# Patient Record
Sex: Male | Born: 1955 | Race: White | Hispanic: No | Marital: Married | Smoking: Never smoker
Health system: Southern US, Community
[De-identification: ages and names within clinical notes are randomized; demographics above are authoritative.]

## PROBLEM LIST (undated history)

## (undated) DIAGNOSIS — G4730 Sleep apnea, unspecified: Secondary | ICD-10-CM

## (undated) DIAGNOSIS — K219 Gastro-esophageal reflux disease without esophagitis: Secondary | ICD-10-CM

## (undated) DIAGNOSIS — K5090 Crohn's disease, unspecified, without complications: Secondary | ICD-10-CM

## (undated) DIAGNOSIS — M1990 Unspecified osteoarthritis, unspecified site: Secondary | ICD-10-CM

## (undated) DIAGNOSIS — Z87442 Personal history of urinary calculi: Secondary | ICD-10-CM

## (undated) DIAGNOSIS — L570 Actinic keratosis: Secondary | ICD-10-CM

## (undated) DIAGNOSIS — K760 Fatty (change of) liver, not elsewhere classified: Secondary | ICD-10-CM

## (undated) DIAGNOSIS — I10 Essential (primary) hypertension: Secondary | ICD-10-CM

## (undated) HISTORY — DX: Essential (primary) hypertension: I10

## (undated) HISTORY — DX: Actinic keratosis: L57.0

## (undated) HISTORY — DX: Gastro-esophageal reflux disease without esophagitis: K21.9

---

## 2004-06-19 ENCOUNTER — Ambulatory Visit: Payer: Self-pay | Admitting: Internal Medicine

## 2004-08-01 ENCOUNTER — Ambulatory Visit: Payer: Self-pay | Admitting: Unknown Physician Specialty

## 2006-07-05 ENCOUNTER — Inpatient Hospital Stay: Payer: Self-pay | Admitting: Internal Medicine

## 2006-08-23 ENCOUNTER — Inpatient Hospital Stay: Payer: Self-pay | Admitting: Internal Medicine

## 2006-10-30 ENCOUNTER — Inpatient Hospital Stay: Payer: Self-pay | Admitting: General Surgery

## 2006-10-30 ENCOUNTER — Other Ambulatory Visit: Payer: Self-pay

## 2007-05-29 ENCOUNTER — Ambulatory Visit: Payer: Self-pay | Admitting: Unknown Physician Specialty

## 2008-07-15 ENCOUNTER — Inpatient Hospital Stay: Payer: Self-pay | Admitting: Internal Medicine

## 2009-03-16 IMAGING — CT CT ABD-PELV W/ CM
1 of 2 series · 15 of 32 positions shown, 19 images · non-contrast
Comparison: none

REASON FOR EXAM: crohn's flare
COMMENTS:

[Series 2: abdomen · axial · 0.90mm/px · z∈[-894,-454]mm · 15 of 61 slices shown, 19 images]
[im 3/61  soft-tissue]
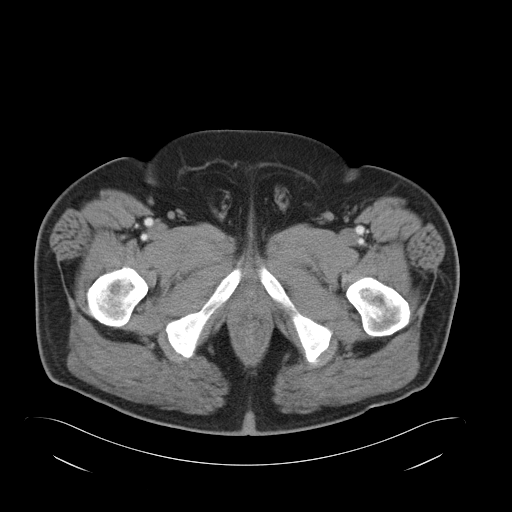
[im 3/61  bone]
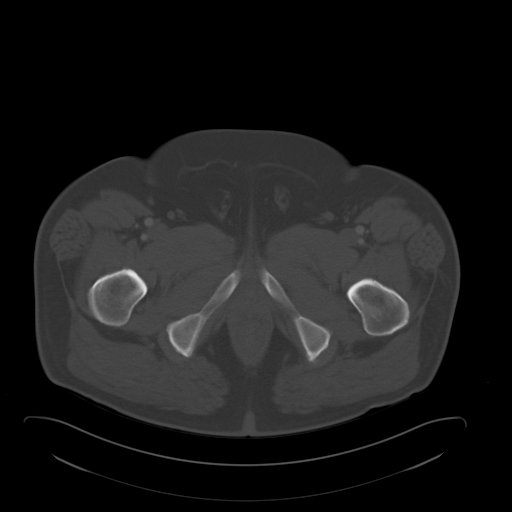
[im 8/61  soft-tissue]
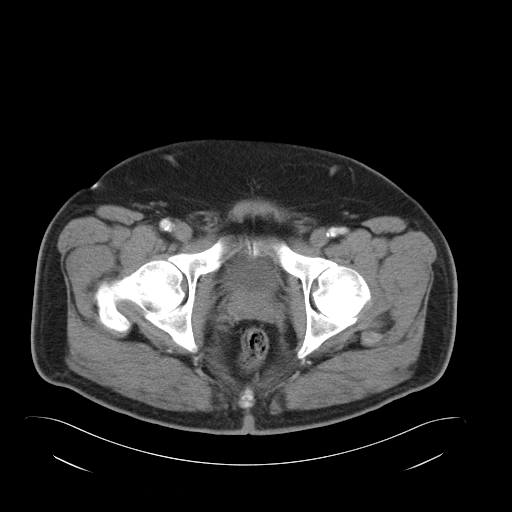
[im 13/61  soft-tissue]
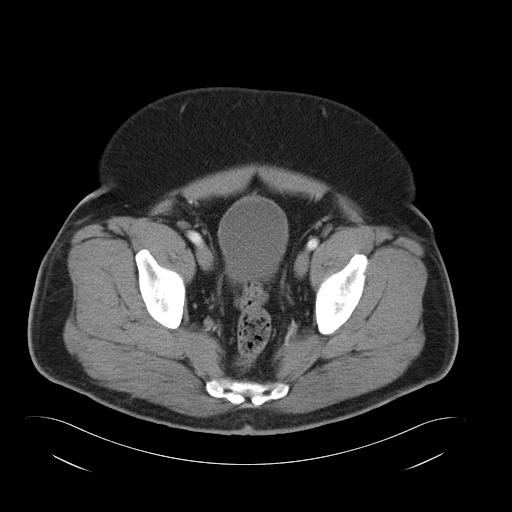
[im 18/61  soft-tissue]
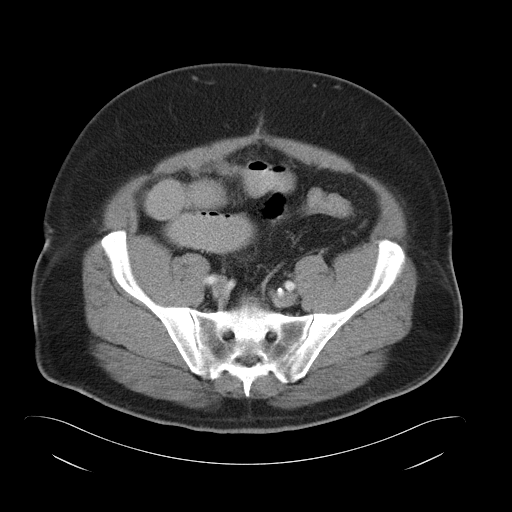
[im 21/61  soft-tissue]
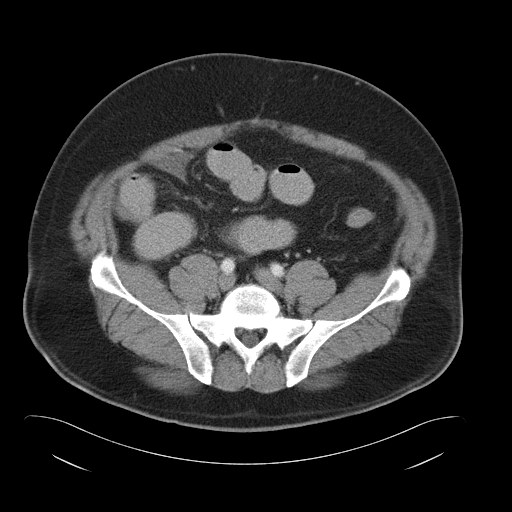
[im 26/61  soft-tissue]
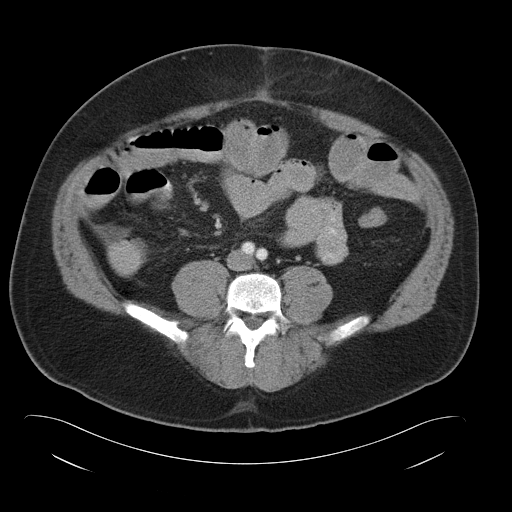
[im 31/61  soft-tissue]
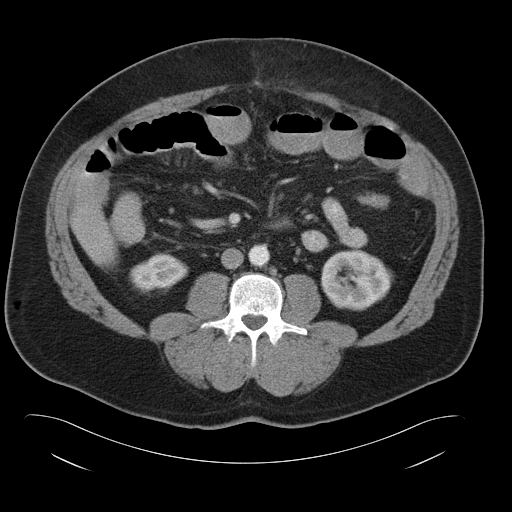
[im 36/61  soft-tissue]
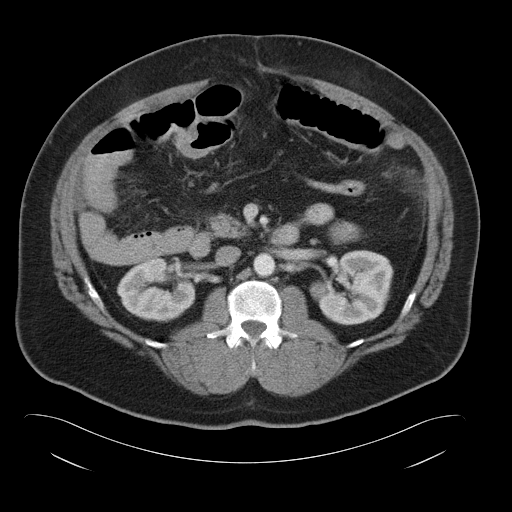
[im 41/61  soft-tissue]
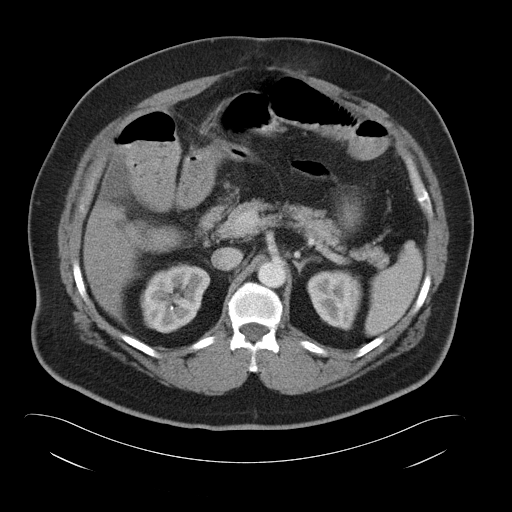
[im 41/61  bone]
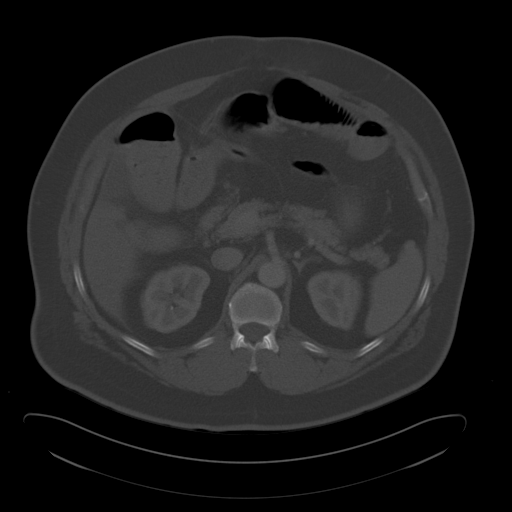
[im 43/61  soft-tissue]
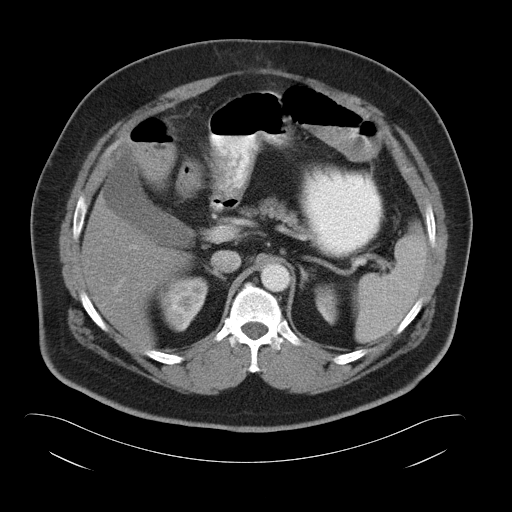
[im 48/61  soft-tissue]
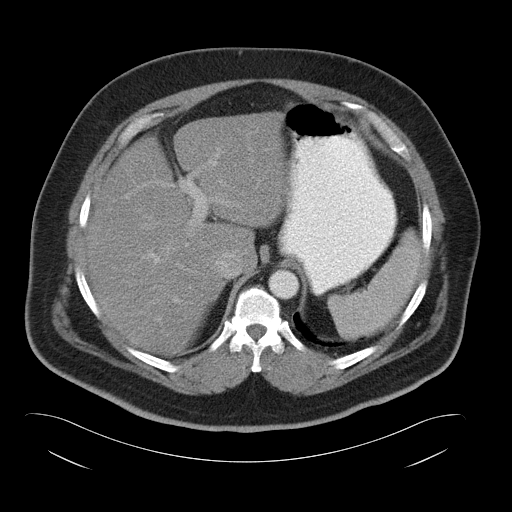
[im 51/61  lung]
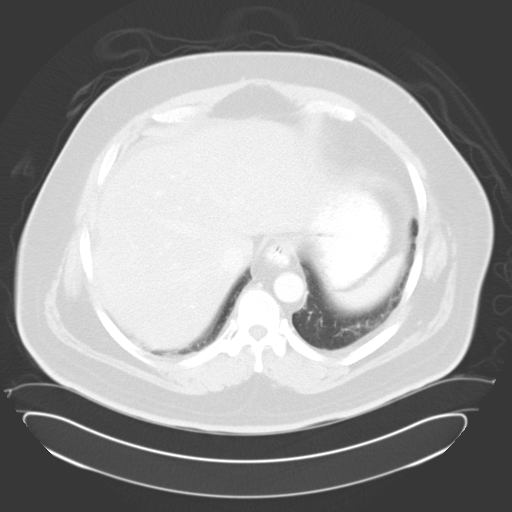
[im 53/61  soft-tissue]
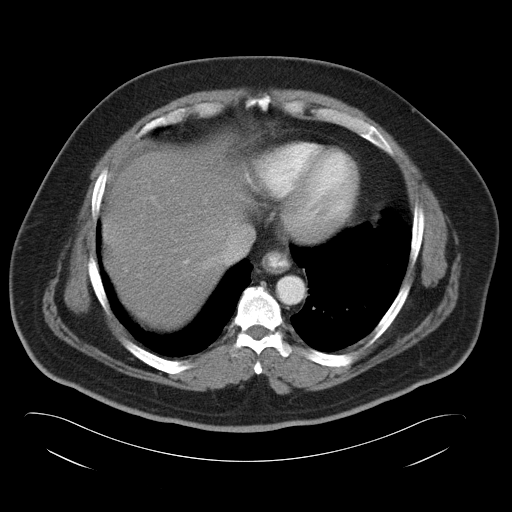
[im 53/61  lung]
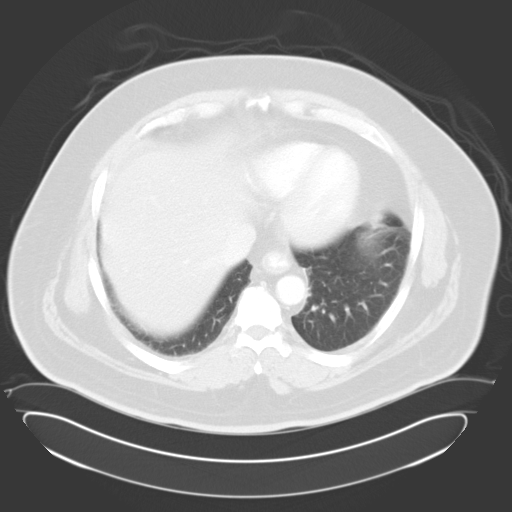
[im 56/61  lung]
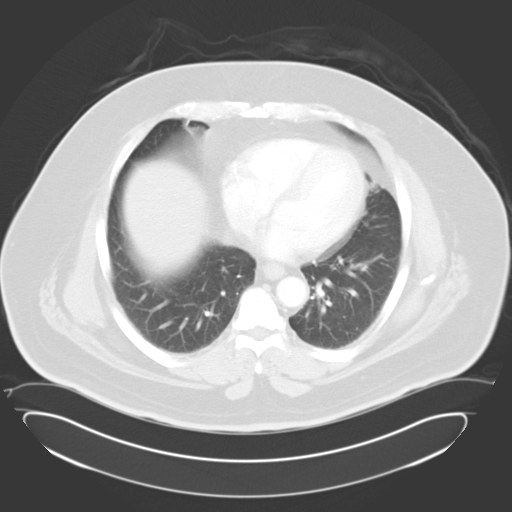
[im 58/61  soft-tissue]
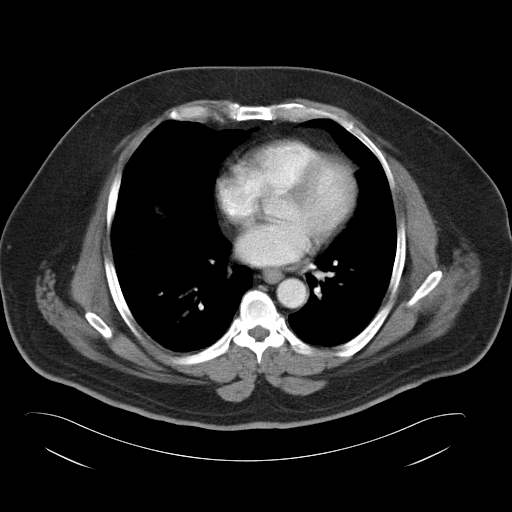
[im 58/61  lung]
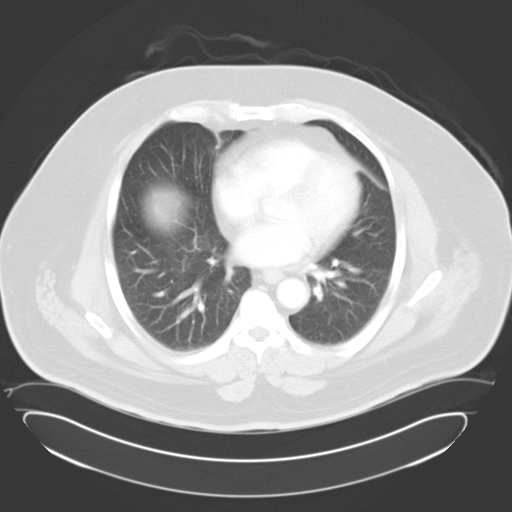

[15 of 32 positions shown; findings below may reference images not displayed]

PROCEDURE:     CT  - CT ABDOMEN / PELVIS  W  - July 06, 2006  [DATE]

RESULT:      IV and oral contrast enhanced CT of the Abdomen and Pelvis was
obtained.  The liver and spleen are normal.  The pancreas is unremarkable.
The gallbladder is nondistended.  No biliary distention is noted.  A small
sliding hiatal hernia is present. The adrenals and kidneys are normal.
RIGHT nephrolithiasis is present.  Dilated loops of small and large bowel
are noted.  By history, the patient has Crohn's disease.  The dilated loops
of small bowel may be related to Crohn's disease or partial small bowel
obstruction. No evidence of complete obstruction, however, is identified.
No evidence of abscess.  The lung bases are clear.
IMPRESSION: 1.     Mild to moderately dilated loops of small bowel.  By history, the
patient has Crohn's disease. These changes may be related to Crohn's
disease.
2.     There is no evidence of complete bowel obstruction.  Stool is noted
in the colon.  It may be wise to perform follow up abdominal series to
exclude developing bowel obstruction.  There is no evidence of
intraabdominal or intrapelvic abscess. The remainder of the exam is
unremarkable.

## 2009-12-27 ENCOUNTER — Ambulatory Visit: Payer: Self-pay | Admitting: Unknown Physician Specialty

## 2010-06-15 ENCOUNTER — Ambulatory Visit: Payer: Self-pay | Admitting: Unknown Physician Specialty

## 2012-01-31 ENCOUNTER — Ambulatory Visit: Payer: Self-pay | Admitting: Unknown Physician Specialty

## 2012-04-15 HISTORY — PX: COLON SURGERY: SHX602

## 2012-04-15 HISTORY — PX: HERNIA REPAIR: SHX51

## 2012-07-23 ENCOUNTER — Ambulatory Visit: Payer: Self-pay | Admitting: Unknown Physician Specialty

## 2012-08-07 ENCOUNTER — Ambulatory Visit: Payer: Self-pay | Admitting: Surgery

## 2012-08-24 ENCOUNTER — Ambulatory Visit: Payer: Self-pay | Admitting: Surgery

## 2012-08-31 ENCOUNTER — Inpatient Hospital Stay: Payer: Self-pay | Admitting: Surgery

## 2012-09-01 LAB — BASIC METABOLIC PANEL
Anion Gap: 7 (ref 7–16)
BUN: 8 mg/dL (ref 7–18)
Calcium, Total: 8.4 mg/dL — ABNORMAL LOW (ref 8.5–10.1)
Creatinine: 0.57 mg/dL — ABNORMAL LOW (ref 0.60–1.30)
EGFR (Non-African Amer.): >60
Glucose: 115 mg/dL — ABNORMAL HIGH (ref 65–99)
Potassium: 3.3 mmol/L — ABNORMAL LOW (ref 3.5–5.1)

## 2012-09-01 LAB — CBC WITH DIFFERENTIAL/PLATELET
Basophil #: 0 10*3/uL (ref 0.0–0.1)
Basophil %: 0.4 %
Eosinophil #: 0 10*3/uL (ref 0.0–0.7)
Eosinophil %: 0.3 %
Lymphocyte %: 7.3 %
MCHC: 33.7 g/dL (ref 32.0–36.0)
MCV: 79 fL — ABNORMAL LOW (ref 80–100)
Monocyte #: 1.2 x10 3/mm — ABNORMAL HIGH (ref 0.2–1.0)
Monocyte %: 11.2 %
Neutrophil #: 8.8 10*3/uL — ABNORMAL HIGH (ref 1.4–6.5)
Neutrophil %: 80.8 %
RBC: 4.82 10*6/uL (ref 4.40–5.90)
RDW: 17.6 % — ABNORMAL HIGH (ref 11.5–14.5)
WBC: 10.9 10*3/uL — ABNORMAL HIGH (ref 3.8–10.6)

## 2012-09-03 LAB — PATHOLOGY REPORT

## 2013-07-19 ENCOUNTER — Ambulatory Visit: Payer: Self-pay | Admitting: Unknown Physician Specialty

## 2014-08-05 NOTE — Op Note (Signed)
PATIENT NAME:  Chad Cardenas, Chad Cardenas MR#:  [id number] DATE OF BIRTH:  11/27/1955  DATE OF PROCEDURE:  08/31/2012  PREOPERATIVE DIAGNOSIS: Crohn's disease, with 2 left colonic strictures.   POSTOPERATIVE DIAGNOSES: Crohn's disease, with 2 left colonic strictures,  adhesive peritonitis, multiple ventral hernias.   OPERATION: Left colectomy, repair of 5 ventral hernias, extensive enterolysis.   SURGEON: Renda RollsWilton Dionel Archey, M.D.   ANESTHESIA: General.   INDICATIONS: This 59 year old male has a history of Crohn's disease. He had had a previous right colectomy in 1992, and in 2008 had resection of the anastomosis. He had a recent attempted colonoscopy. However, the scope would not go by the stricture in the descending colon. Subsequent barium enema demonstrated 2 strictures, one was at the old anastomosis and the other in the junction of the descending colon and sigmoid colon, and surgery was recommended for definitive treatment.   DESCRIPTION OF PROCEDURE: The patient was placed on the operating table in the supine position under general endotracheal anesthesia. The abdomen was prepared with ChloraPrep and draped in a sterile manner.   A midline incision was made at the site of an old scar and was lengthened several times to ultimately some 21 cm. As the fascia was opened there were findings of multiple intra-abdominal adhesions between the omentum and the anterior abdominal wall, and also between the small bowel and the anterior abdominal wall. Multiple adhesions were taken down. Five ventral hernias were found during the course of the dissection, and 3 of the ventral hernias were repaired during the beginning of the operation. There were 2 ventral hernias which were found which were approximately 1 cm off the midline in the portion of the incision just above the umbilicus. Each of these were closed with transversely-oriented 0 Maxon figure-of-eight sutures. There was another ventral hernia found in the epigastrium  which was on the patient's right side, and this had a defect of some 2 cm in the properitoneal fat. This was dissected away and it was closed transversely with interrupted 0 Maxon figure-of-eight sutures. There were two other hernias in the more cephalad portion of the incision that were repaired at the end of the operation.   Next, extensive enterolysis was undertaken, finding an extensive adhesive peritonitis, and that  multiple segments of small bowel were adherent to one another. There was evidence of a mass formation surrounding the junction of the small bowel and the transverse colon which was in the upper abdominal midline. There was appearance consistent with Crohn's disease of creeping fat involving the left colon. The dissection of the adhesions continued for more than 2 hours and more than half the operation. Subsequently mobilizing the transverse colon, mobilizing the splenic flexure the segments of bowel to be repaired were identified, finding some soft, supple proximal sigmoid colon and a window was created in the mesentery. The mesenteric dissection was begun with a Harmonic scalpel. Also, a window was created in the small bowel mesentery just proximal to the old anastomosis and dissection here began with a Harmonic scalpel. It is further noted that the omentum was dissected away from the left transverse colon, also finding multiple adhesions in this area.   The dissection was continued, further mobilizing the splenic flexure, and subsequently the bowel was satisfactorily mobilized. The mesenteric dissection was continued. Several clamped vessels were suture-ligated with 0 chromic and the bowel was resected proximally and distally by transecting with electrocautery, and each end was held up with Allis clamps. The specimen was submitted in formalin for routine pathology.  The anastomosis was carried out with the triangulation technique, actually using 4 loads of the TA-30 stapler beginning along the  posterior wall, and then continued until the anastomosis was completed. It was widely patent and was inspected and appeared to be intact.   Gloves and gown were changed, and also the Mayo stand and instruments were changed. Next, the mesenteric defect was closed with running 3-0 chromic. It was noted that during the course of the procedure there was approximately 50 mL of blood loss, and by this point hemostasis appeared to be intact. The bowel was returned to the abdominal cavity, and the omentum which remained was brought beneath the wound.   Next, two additional ventral hernias were repaired in the upper aspect of the wound, which each of these had a defect of 2 cm, one on each side at the epigastric midline fascia, and each of these were closed with a transversely-oriented suture line of interrupted 0 Maxon figure-of-eight sutures.   Next, the midline incision was closed with interrupted 0 Maxon figure-of-eight sutures in the midline fascia and then the skin was closed with clips. Dressings were applied using 4 x 4 gauze and Micropore tape.   The patient had been asleep for some 5 hours, and I had the circulating nurse inserted a Foley urinary catheter with Betadine preparation of the penis, draining 100 mL of clear, colorless urine.   The patient is now being prepared for transfer to the recovery room.   This operation was greater than the usual amount of effort, lasting some 5 hours, and more than half the operation was spent in lysis of adhesions and a significant amount, more than 30 minutes, spent repairing the 5 ventral hernia.   The patient was in satisfactory condition and being prepared for transfer to the recovery room in stable condition.   ____________________________ Shela Commons. Renda Rolls, MD jws:dm D: 08/31/2012 12:44:06 ET T: 08/31/2012 13:56:35 ET JOB#: [id number]  cc: Adella Hare, MD, <Dictator> Adella Hare MD ELECTRONICALLY SIGNED 09/01/2012 10:20

## 2014-08-05 NOTE — Discharge Summary (Signed)
PATIENT NAME:  Chad Cardenas, Anthoni MR#:  [id number] DATE OF BIRTH:  01-05-1956  DATE OF ADMISSION:  08/31/2012 DATE OF DISCHARGE:  09/04/2012  DATE OF ADMISSION: 08/31/2012.   DATE OF DISCHARGE: 09/04/2012.   HISTORY OF PRESENT ILLNESS: This 59 year old was brought in for elective surgery and has a history of Crohn's disease. Has had previous right colectomy. Had recent findings of 2 colonic strictures involving the left colon.   Other past medical history includes hypertension, sleep apnea, hyperlipidemia, B12 deficiency fatty liver.   Details of the history and physical were recorded.  HOSPITAL COURSE: He was brought in through the outpatient surgery department. He did have a preop prophylactic antibiotic. Had a laparotomy with findings of extensive adhesive peritonitis. Much time was spent lysing adhesions. Also had 5 ventral hernias, all of which were repaired. He had the left colectomy.   Postoperatively, he was treated with IV fluids, also prophylactic subcutaneous heparin. He was begun initially on a clear liquid diet and later advanced to full liquids and gradually advanced to solid food. He had minimal amount of pain postoperatively and progressed satisfactorily.   His final pathology did demonstrate Crohn's disease with ulceration and stricture.   FINAL DIAGNOSES:  1. Crohn's disease of left colon with 2 strictures with partial obstruction.  2. Adhesive peritonitis. 3. Five ventral hernias.   DISCHARGE INSTRUCTIONS: Wound care instructions given. Avoid straining or heavy lifting. Follow up in the office for clip removal. Also anticipate gastroenterology followup, and at some point, resume his Humira.  ____________________________ Shela CommonsJ. Renda RollsWilton Smith, MD jws:OSi D: 09/18/2012 09:23:35 ET T: 09/18/2012 09:30:56 ET JOB#: [id number]  cc: Adella HareJ. Wilton Smith, MD, <Dictator> Adella HareWILTON J SMITH MD ELECTRONICALLY SIGNED 09/23/2012 8:39

## 2015-02-09 ENCOUNTER — Ambulatory Visit (INDEPENDENT_AMBULATORY_CARE_PROVIDER_SITE_OTHER): Payer: BC Managed Care – PPO | Admitting: Podiatry

## 2015-02-09 ENCOUNTER — Ambulatory Visit (INDEPENDENT_AMBULATORY_CARE_PROVIDER_SITE_OTHER): Payer: BC Managed Care – PPO

## 2015-02-09 ENCOUNTER — Encounter: Payer: Self-pay | Admitting: Podiatry

## 2015-02-09 VITALS — BP 149/98 | HR 67 | Resp 18

## 2015-02-09 DIAGNOSIS — M722 Plantar fascial fibromatosis: Secondary | ICD-10-CM

## 2015-02-09 NOTE — Progress Notes (Signed)
   Subjective:    Patient ID: Chad Cardenas, male    DOB: 27-Apr-1955, 59 y.o.   MRN: [id number]  HPI  59 year old male presents the office with concerns of right heel pain foot pain which has been ongoing for approximately 2 months. He states he has a lot of pain when he first gets up or after standing all day. He's been taking ibuprofen which seems to help some does not provide relief. He denies a history of injury or trauma to the area. No swelling or redness. No tingling or numbness. The pain does not wake him up at night. No other complaints at this time.   Review of Systems  All other systems reviewed and are negative.      Objective:   Physical Exam General: AAO x3, NAD  Dermatological: Skin is warm, dry and supple bilateral. Nails x 10 are well manicured; remaining integument appears unremarkable at this time. There are no open sores, no preulcerative lesions, no rash or signs of infection present.  Vascular: Dorsalis Pedis artery and Posterior Tibial artery pedal pulses are 2/4 bilateral with immedate capillary fill time. Pedal hair growth present. No varicosities and no lower extremity edema present bilateral. There is no pain with calf compression, swelling, warmth, erythema.   Neruologic: Grossly intact via light touch bilateral. Vibratory intact via tuning fork bilateral. Protective threshold with Semmes Wienstein monofilament intact to all pedal sites bilateral. Patellar and Achilles deep tendon reflexes 2+ bilateral. No Babinski or clonus noted bilateral.   Musculoskeletal: No gross boney pedal deformities bilateral. This to palpation of the plantar medial tubercle of the calcaneus at the insertion the plantar fascia of the right side. There is mild discomfort on the medial band of the plantar fascia within the arch of the foot. Plantar fascia appears to be intact. There is no pain along the course of the Achilles tendon however equinus is present. There is no pain with lateral  compression of the calcaneus. There is no other areas of tenderness to bilateral lower extremities.No pain, crepitus, or limitation noted with foot and ankle range of motion bilateral. Muscular strength 5/5 in all groups tested bilateral.  Gait: Unassisted, Nonantalgic.      Assessment & Plan:  59 year old male with right heel/foot pain, likely plantar fasciitis -X-rays were obtained and reviewed with the patient.  -Treatment options discussed including all alternatives, risks, and complications -Etiology of symptoms were discussed -Patient elects to proceed with steroid injection into the right heel. Under sterile skin preparation, a total of 2.5cc of kenalog 10, 0.5% Marcaine plain, and 2% lidocaine plain were infiltrated into the symptomatic area without complication. A band-aid was applied. Patient tolerated the injection well without complication. Post-injection care with discussed with the patient. Discussed with the patient to ice the area over the next couple of days to help prevent a steroid flare.  -Plantar fascial brace dispensed -Ice to the area. Discussed stretching exercises daily. -Continue supportive shoe gear. Discussed orthotics. He did recently purchased new shoes and orthotics which seem to help. Continue with this. -Follow-up in 4 weeks or sooner if any problems arise. In the meantime, encouraged to call the office with any questions, concerns, change in symptoms.   Ovid CurdMatthew Wagoner, DPM

## 2015-02-09 NOTE — Patient Instructions (Signed)

## 2015-03-02 ENCOUNTER — Encounter: Payer: Self-pay | Admitting: Podiatry

## 2015-03-02 ENCOUNTER — Ambulatory Visit (INDEPENDENT_AMBULATORY_CARE_PROVIDER_SITE_OTHER): Payer: BC Managed Care – PPO | Admitting: Podiatry

## 2015-03-02 VITALS — BP 159/99 | HR 71 | Resp 18

## 2015-03-02 DIAGNOSIS — M722 Plantar fascial fibromatosis: Secondary | ICD-10-CM | POA: Diagnosis not present

## 2015-03-02 DIAGNOSIS — M2010 Hallux valgus (acquired), unspecified foot: Secondary | ICD-10-CM | POA: Insufficient documentation

## 2015-03-02 DIAGNOSIS — M2011 Hallux valgus (acquired), right foot: Secondary | ICD-10-CM

## 2015-03-02 NOTE — Progress Notes (Signed)
Patient ID: Chad Cardenas, male   DOB: 1955/09/12, 59 y.o.   MRN: [id number]  Subjective: Chad Cardenas presents to the office today for follow-up evaluation of right heel pain. They state that they are doing much better and his pain has greatly improved. They have been icing, stretching, try to wear supportive shoe as much as possible. Denies any  Numbness or tingling. No swelling or redness. The pain does not wake him up at night. He does get some occasional pain to the bunion on the right side was in his shoes. Denies any redness to the area any swelling.  No other complaints at this time. No acute changes since last appointment. They deny any systemic complaints such as fevers, chills, nausea, vomiting.  Objective: General: AAO x3, NAD  Dermatological: Skin is warm, dry and supple bilateral. Nails x 10 are well manicured; remaining integument appears unremarkable at this time. There are no open sores, no preulcerative lesions, no rash or signs of infection present.  Vascular: Dorsalis Pedis artery and Posterior Tibial artery pedal pulses are 2/4 bilateral with immedate capillary fill time. Pedal hair growth present. There is no pain with calf compression, swelling, warmth, erythema.   Neruologic: Grossly intact via light touch bilateral. Vibratory intact via tuning fork bilateral. Protective threshold with Semmes Wienstein monofilament intact to all pedal sites bilateral.   Musculoskeletal: There is greatly improved continue tenderness palpation along the plantar medial tubercle of the calcaneus at the insertion of the plantar fascia on the  right foot. There is  no pain along the course of the plantar fascia within the arch of the foot. Plantar fascia appears to be intact bilaterally. There is no pain with lateral compression of the calcaneus and there is no pain with vibratory sensation. There is no pain along the course or insertion of the Achilles tendon. There are no other areas of  tenderness to bilateral lower extremities.  Mild HAV deformity is present and there is mild tenderness to palpation along the medial aspect of the first metatarsal head on the right foot. There is no overlying erythema , skin lesions or any swelling. No other areas of tenderness.No gross boney pedal deformities bilateral. No pain, crepitus, or limitation noted with foot and ankle range of motion bilateral. Muscular strength 5/5 in all groups tested bilateral.  Gait: Unassisted, Nonantalgic.   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 Under sterile skin preparation, a total of 2.5cc of kenalog 10, 0.5% Marcaine plain, and 2% lidocaine plain were infiltrated into the symptomatic area without complication. A band-aid was applied. Patient tolerated the injection well without complication. Post-injection care with discussed with the patient. Discussed with the patient to ice the area over the next couple of days to help prevent a steroid flare.  -Ice and stretching exercises on a daily basis. -Continue supportive shoe gear. -Discussed offloading pads in shoe gear modifications for the bunion. -Follow-up if symptoms not resolved completely in 3-4 weeks or sooner if any problems arise. In the meantime, encouraged to call the office with any questions, concerns, change in symptoms.   Ovid CurdMatthew Teodoro Jeffreys, DPM

## 2015-04-03 IMAGING — CT CT ABD-PELV W/ CM
1 of 2 series · 14 of 32 positions shown, 18 images · non-contrast
Comparison: none

REASON FOR EXAM: Crohn's Disease
COMMENTS:

[Series 2: abd 3mm w 3.0 i40f 3 · axial · 0.95mm/px · z∈[-950,-542]mm · 14 of 150 slices shown, 18 images]
[im 7/150  soft-tissue]
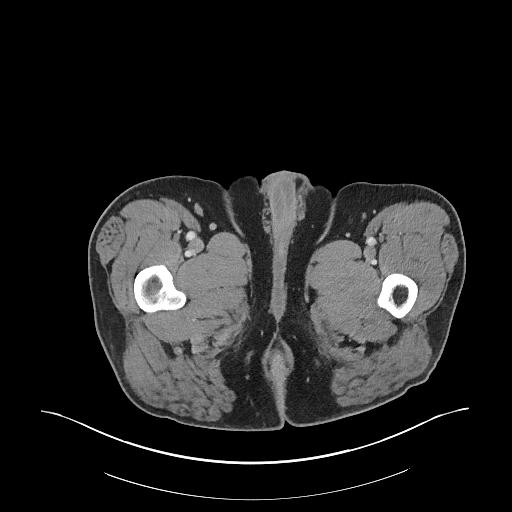
[im 7/150  bone]
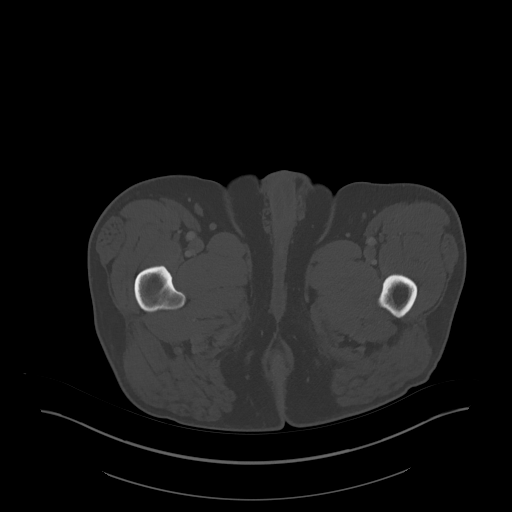
[im 19/150  soft-tissue]
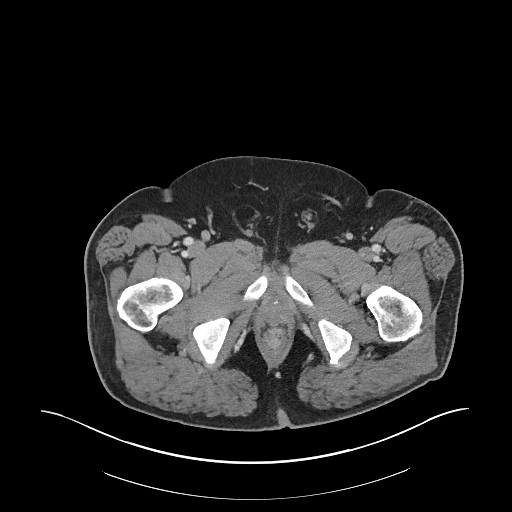
[im 32/150  soft-tissue]
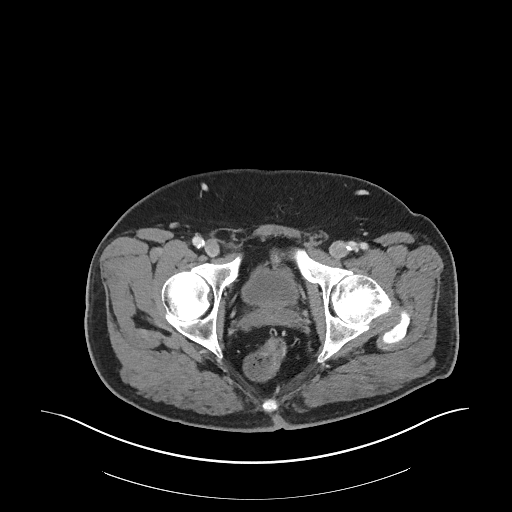
[im 44/150  soft-tissue]
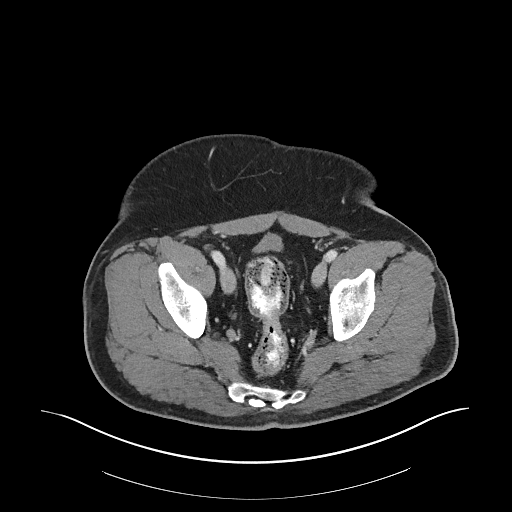
[im 56/150  soft-tissue]
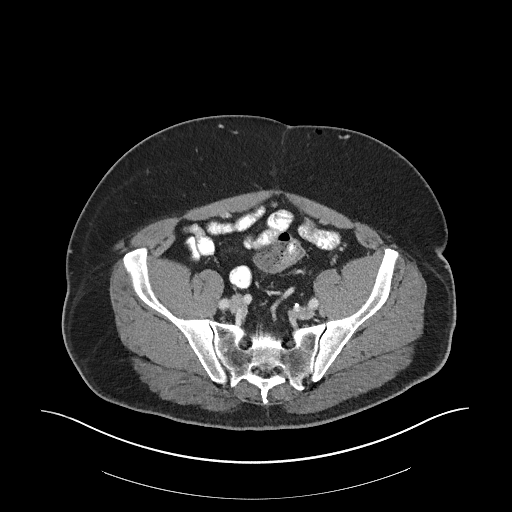
[im 69/150  soft-tissue]
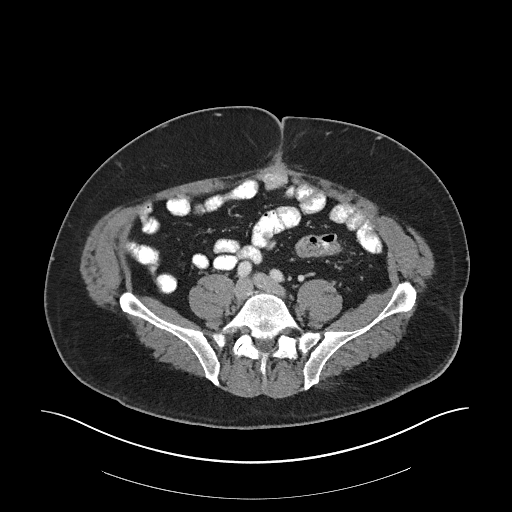
[im 81/150  soft-tissue]
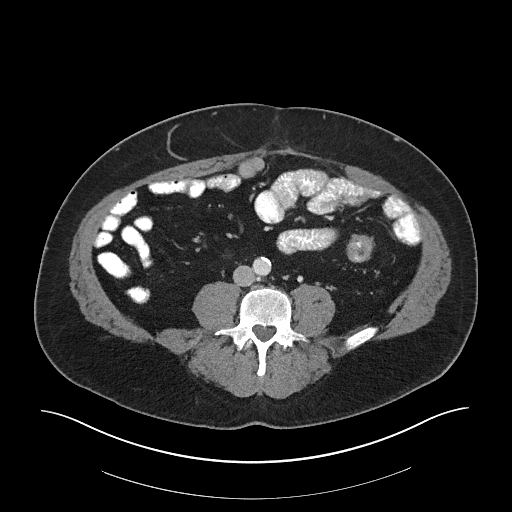
[im 94/150  soft-tissue]
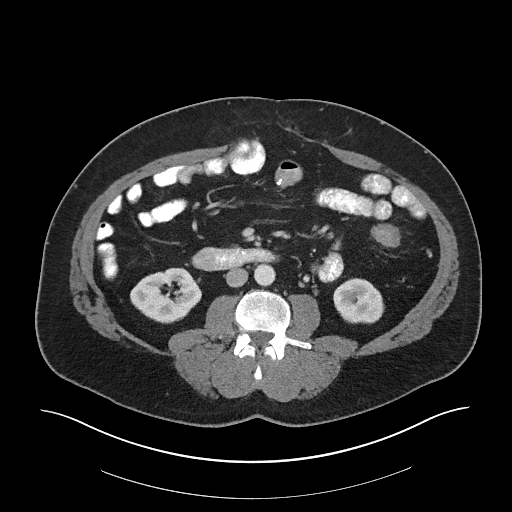
[im 106/150  soft-tissue]
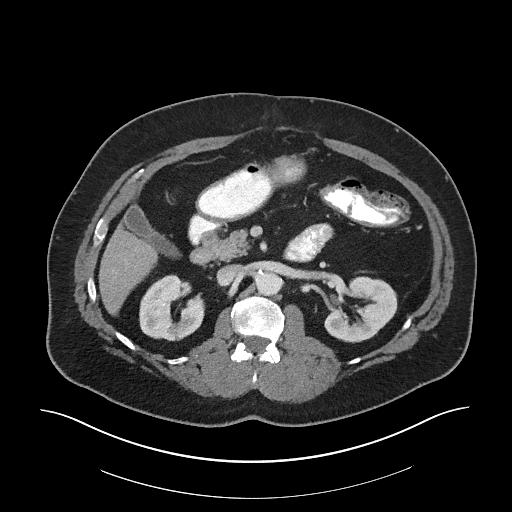
[im 106/150  bone]
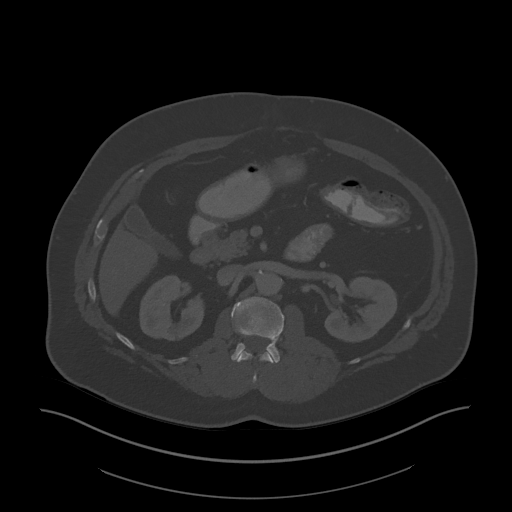
[im 118/150  soft-tissue]
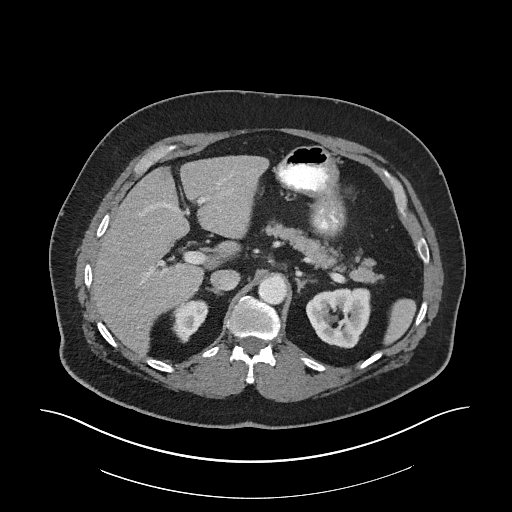
[im 125/150  lung]
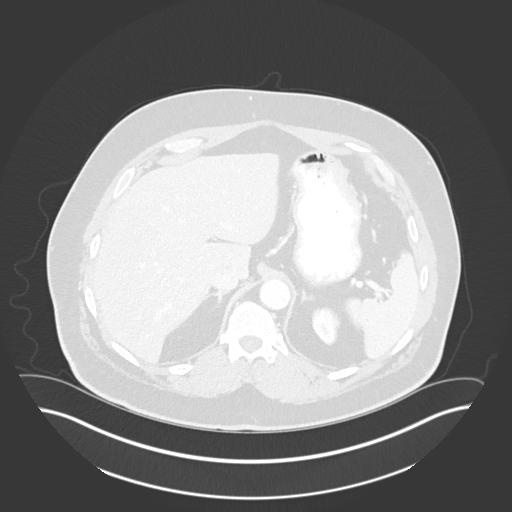
[im 131/150  soft-tissue]
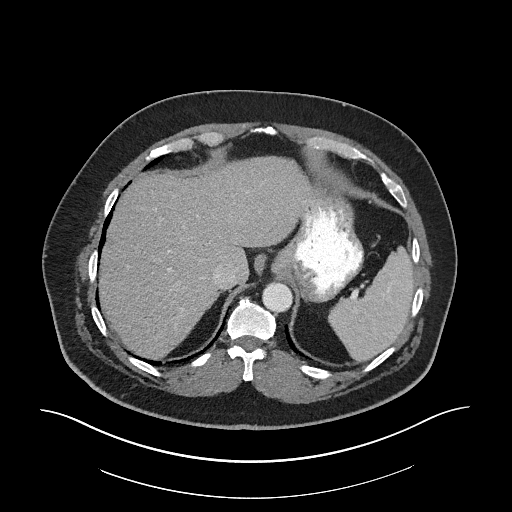
[im 131/150  lung]
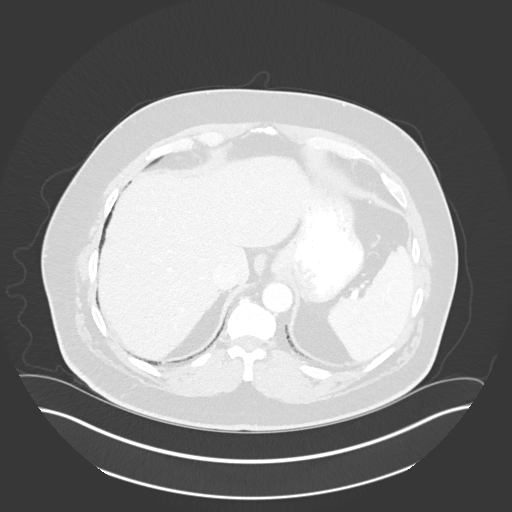
[im 137/150  lung]
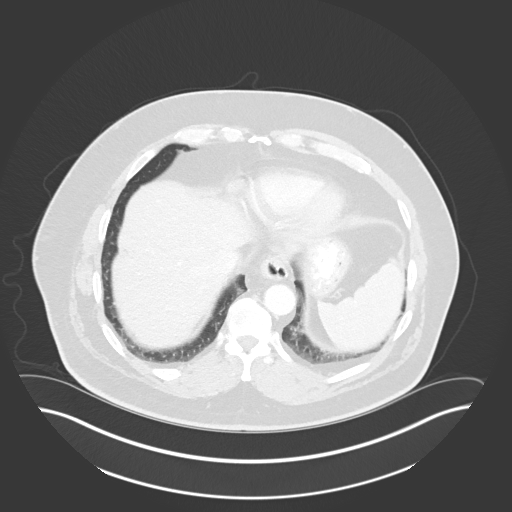
[im 143/150  soft-tissue]
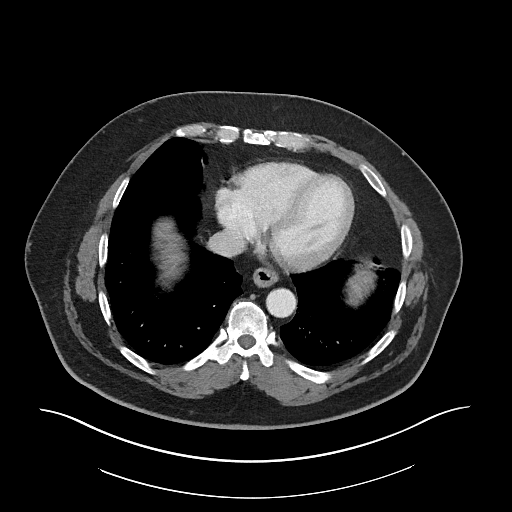
[im 143/150  lung]
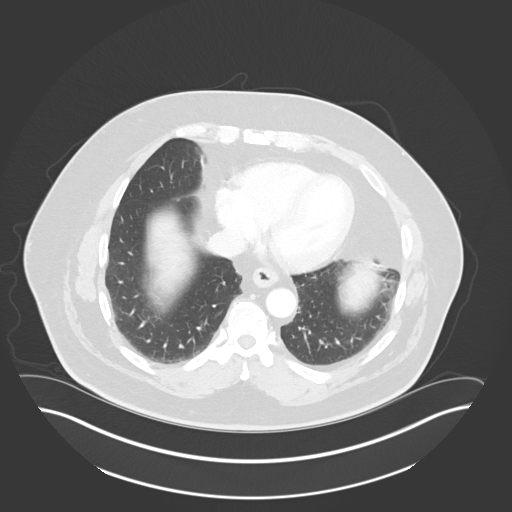

[14 of 32 positions shown; findings below may reference images not displayed]

PROCEDURE:     KCT - KCT ABDOMEN/PELVIS W  - July 23, 2012  [DATE]

RESULT:     Axial CT scanning was performed through the abdomen and pelvis
with reconstructions at 3 mm intervals and slice thicknesses. The patient
received 100 cc of Isovue 370 intravenously and also received oral contrast
material. Review of multiplanar reconstructed images was performed
separately on the VIA monitor. Comparison is made to the study 27 December, 2009.

The stomach is partially distended with contrast and is normal in
appearance. There is a small hiatal hernia. The duodenum, jejunum, and ileum
exhibit no mural thickening or evidence of obstruction. There is a knuckle
of the ileum which enters a broadly necked largely fat containing ventral
hernia. It does not appear incarcerated at this time.

The oral contrast has traversed the small bowel and reached the remaining
right colon. The anastomosis with the small bowel appears to be in the
region of the midtransverse colon. There is mild thickening of the wall of
the descending colon and rectosigmoid. There is no pericolonic inflammatory
change and there is no evidence of obstruction.

The liver exhibits no focal mass or ductal dilation. The gallbladder is
adequately distended with no evidence of stones or wall thickening. The
pancreas, spleen, adrenal glands, and kidneys exhibit no acute abnormality.
A stable subcentimeter low density focus in themidpole cortex of the left
kidney is most compatible with a cyst. A nonobstructing one millimeter
diameter upper pole stone in the right kidney is present. The caliber of the
abdominal aorta is normal. There is no periaortic or pericaval lymph-
adenopathy. The partially distended urinary bladder is normal in appearance.
The prostate gland produces a mild impression upon the urinary bladder base.

The lung bases exhibit no acute abnormalities. The lumbar vertebral bodies
are preserved in height.
IMPRESSION: 1. There is no evidence of obstruction of the small or large bowel
currently. There is a knuckle of small bowel which enters the right aspect
of a fat-containing ventral hernia. It does not appear of acute currently
There is mild thickening of the wall of the anastomosis of the small bowel
with the the transverse colon.
2. There is no acute urinary tract abnormality. There is a nonobstructing 1
mm diameter upper pole stone on the right.
3. There is no acute hepatobiliary abnormality.

[REDACTED]

## 2015-06-13 DIAGNOSIS — K219 Gastro-esophageal reflux disease without esophagitis: Secondary | ICD-10-CM | POA: Insufficient documentation

## 2015-06-13 DIAGNOSIS — G4733 Obstructive sleep apnea (adult) (pediatric): Secondary | ICD-10-CM | POA: Insufficient documentation

## 2015-06-13 DIAGNOSIS — I10 Essential (primary) hypertension: Secondary | ICD-10-CM | POA: Insufficient documentation

## 2016-06-27 ENCOUNTER — Inpatient Hospital Stay: Admission: RE | Admit: 2016-06-27 | Payer: Self-pay | Source: Ambulatory Visit

## 2016-06-27 ENCOUNTER — Encounter
Admission: RE | Admit: 2016-06-27 | Discharge: 2016-06-27 | Disposition: A | Discharge status: Home / Self Care | Payer: BC Managed Care – PPO | Source: Ambulatory Visit | Attending: Surgery | Admitting: Surgery

## 2016-06-27 DIAGNOSIS — I10 Essential (primary) hypertension: Secondary | ICD-10-CM | POA: Insufficient documentation

## 2016-06-27 HISTORY — DX: Sleep apnea, unspecified: G47.30

## 2016-06-27 HISTORY — DX: Fatty (change of) liver, not elsewhere classified: K76.0

## 2016-06-27 HISTORY — DX: Unspecified osteoarthritis, unspecified site: M19.90

## 2016-06-27 HISTORY — DX: Personal history of urinary calculi: Z87.442

## 2016-06-27 HISTORY — DX: Crohn's disease, unspecified, without complications: K50.90

## 2016-06-27 NOTE — Pre-Procedure Instructions (Signed)
MEDICALCLEARANCE/EKG,AS INSTRUCTED BY DR Ether GriffinsJ PISCITELLO CALLED AND FAXED TO DR B Graciela HusbandsKLEIN. SPOKE WITH JESSICA. ALSO NOTIFIED Syesha Thaw AT DR Malissa HippoW SMITH

## 2016-06-27 NOTE — Patient Instructions (Signed)
Your procedure is scheduled on: July 04, 2016 (Thursday) Report to Same Day Surgery 2nd floor medical mall [facility](Medical Mall Entrance-take elevator on left to 2nd floor.  Check in with surgery information desk.) To find out your arrival time please call [phone] between 1PM - 3PM on July 03, 2016 (Wednesday)  Remember: Instructions that are not followed completely may result in serious medical risk, up to and including death, or upon the discretion of your surgeon and anesthesiologist your surgery may need to be rescheduled.    _x___ 1. Do not eat food or drink liquids after midnight. No gum chewing or  hard candies.                                __x__ 2. No Alcohol for 24 hours before or after surgery.   __x__3. No Smoking for 24 prior to surgery.   ____  4. Bring all medications with you on the day of surgery if instructed.    __x__ 5. Notify your doctor if there is any change in your medical condition     (cold, fever, infections).     Do not wear jewelry, make-up, hairpins, clips or nail polish.  Do not wear lotions, powders, or perfumes. You may wear deodorant.  Do not shave 48 hours prior to surgery. Men may shave face and neck.  Do not bring valuables to the hospital.    [facility] for any belongings or valuables.               Contacts, dentures or bridgework may not be worn into surgery.  Leave your suitcase in the car. After surgery it may be brought to your room.  For patients admitted to the hospital, discharge time is determined by your                       treatment team.   Patients discharged the day of surgery will not be allowed to drive home.  You will need someone to drive you home and stay with you the night of your procedure.    Please read over the following fact sheets that you were given:   [facility] and or MRSA Information   _x___ Take anti-hypertensive (unless it includes a diuretic), cardiac, seizure,  asthma,     anti-reflux and psychiatric medicines. These include:  1. Protonix (Protonix at bedtime on Wednesday night  3/21)  2.  3.  4.  5.  6.  ____Fleets enema or Magnesium Citrate as directed.   _x___ Use CHG Soap or sage wipes as directed on instruction sheet   ____ Use inhalers on the day of surgery and bring to hospital day of surgery  ____ Stop Metformin and Janumet 2 days prior to surgery.    ____ Take 1/2 of usual insulin dose the night before surgery and none on the morning surgery    .   _x___ Follow recommendations from Cardiologist, Pulmonologist or PCP regarding          stopping Aspirin, Coumadin, Pllavix ,Eliquis, Effient, or Pradaxa, and Pletal.  X____Stop Anti-inflammatories such as Advil, Aleve, Ibuprofen, Motrin, Naproxen, Naprosyn, Goodies powders or aspirin products. OK to take Tylenol    _x___ Stop supplements until after surgery.  But may continue Vitamin D, Vitamin B,       and multivitamin.   _x___ Bring C-Pap to the  hospital.

## 2016-07-01 NOTE — Pre-Procedure Instructions (Signed)
CLEARED MEDIUM RISK 06/30/16 BY DR Leonard SchwartzB Chad HusbandsKLEIN

## 2016-07-04 ENCOUNTER — Encounter: Payer: Self-pay | Admitting: *Deleted

## 2016-07-04 ENCOUNTER — Encounter
Admission: RE | Disposition: A | Discharge status: Home / Self Care | Payer: Self-pay | Source: Ambulatory Visit | Attending: Surgery

## 2016-07-04 ENCOUNTER — Ambulatory Visit: Payer: BC Managed Care – PPO | Admitting: Anesthesiology

## 2016-07-04 ENCOUNTER — Ambulatory Visit
Admission: RE | Admit: 2016-07-04 | Discharge: 2016-07-04 | Disposition: A | Discharge status: Home / Self Care | Payer: BC Managed Care – PPO | Source: Ambulatory Visit | Attending: Surgery | Admitting: Surgery

## 2016-07-04 DIAGNOSIS — K439 Ventral hernia without obstruction or gangrene: Secondary | ICD-10-CM | POA: Diagnosis present

## 2016-07-04 DIAGNOSIS — I10 Essential (primary) hypertension: Secondary | ICD-10-CM | POA: Insufficient documentation

## 2016-07-04 DIAGNOSIS — K219 Gastro-esophageal reflux disease without esophagitis: Secondary | ICD-10-CM | POA: Diagnosis not present

## 2016-07-04 DIAGNOSIS — M1990 Unspecified osteoarthritis, unspecified site: Secondary | ICD-10-CM | POA: Insufficient documentation

## 2016-07-04 DIAGNOSIS — G4730 Sleep apnea, unspecified: Secondary | ICD-10-CM | POA: Diagnosis not present

## 2016-07-04 DIAGNOSIS — K760 Fatty (change of) liver, not elsewhere classified: Secondary | ICD-10-CM | POA: Diagnosis not present

## 2016-07-04 HISTORY — PX: VENTRAL HERNIA REPAIR: SHX424

## 2016-07-04 SURGERY — REPAIR, HERNIA, VENTRAL
Anesthesia: General

## 2016-07-04 MED ORDER — CEFAZOLIN SODIUM-DEXTROSE 2-4 GM/100ML-% IV SOLN
2.0000 g | Freq: Once | INTRAVENOUS | Status: AC
  Administered 2016-07-04: 2 g via INTRAVENOUS

## 2016-07-04 MED ORDER — DEXAMETHASONE SODIUM PHOSPHATE 10 MG/ML IJ SOLN
INTRAMUSCULAR | Status: DC | PRN
  Administered 2016-07-04: 5 mg via INTRAVENOUS

## 2016-07-04 MED ORDER — FENTANYL CITRATE (PF) 100 MCG/2ML IJ SOLN
INTRAMUSCULAR | Status: DC | PRN
  Administered 2016-07-04 (×2): 50 ug via INTRAVENOUS

## 2016-07-04 MED ORDER — LIDOCAINE HCL (CARDIAC) 20 MG/ML IV SOLN
INTRAVENOUS | Status: DC | PRN
  Administered 2016-07-04: 60 mg via INTRAVENOUS

## 2016-07-04 MED ORDER — HYDROCODONE-ACETAMINOPHEN 5-325 MG PO TABS: 1.0000 | ORAL_TABLET | ORAL | 0 refills | Status: DC | PRN

## 2016-07-04 MED ORDER — SUGAMMADEX SODIUM 200 MG/2ML IV SOLN
INTRAVENOUS | Status: DC | PRN
  Administered 2016-07-04: 400 mg via INTRAVENOUS

## 2016-07-04 MED ORDER — ACETAMINOPHEN 10 MG/ML IV SOLN
INTRAVENOUS | Status: AC
  Filled 2016-07-04: qty 100

## 2016-07-04 MED ORDER — ROCURONIUM BROMIDE 50 MG/5ML IV SOLN
INTRAVENOUS | Status: AC
  Filled 2016-07-04: qty 1

## 2016-07-04 MED ORDER — ROCURONIUM BROMIDE 100 MG/10ML IV SOLN
INTRAVENOUS | Status: DC | PRN
  Administered 2016-07-04: 30 mg via INTRAVENOUS
  Administered 2016-07-04 (×3): 10 mg via INTRAVENOUS

## 2016-07-04 MED ORDER — BUPIVACAINE-EPINEPHRINE (PF) 0.5% -1:200000 IJ SOLN
INTRAMUSCULAR | Status: AC
  Filled 2016-07-04: qty 30

## 2016-07-04 MED ORDER — CEFAZOLIN SODIUM-DEXTROSE 2-4 GM/100ML-% IV SOLN
INTRAVENOUS | Status: AC
  Filled 2016-07-04: qty 100

## 2016-07-04 MED ORDER — ONDANSETRON HCL 4 MG/2ML IJ SOLN: 4.0000 mg | Freq: Once | INTRAMUSCULAR | Status: DC | PRN

## 2016-07-04 MED ORDER — FENTANYL CITRATE (PF) 100 MCG/2ML IJ SOLN: 25.0000 ug | INTRAMUSCULAR | Status: DC | PRN

## 2016-07-04 MED ORDER — PHENYLEPHRINE HCL 10 MG/ML IJ SOLN
INTRAMUSCULAR | Status: DC | PRN
  Administered 2016-07-04: 100 ug via INTRAVENOUS
  Administered 2016-07-04: 150 ug via INTRAVENOUS
  Administered 2016-07-04: 200 ug via INTRAVENOUS
  Administered 2016-07-04: 100 ug via INTRAVENOUS

## 2016-07-04 MED ORDER — PROPOFOL 10 MG/ML IV BOLUS
INTRAVENOUS | Status: DC | PRN
  Administered 2016-07-04: 170 mg via INTRAVENOUS

## 2016-07-04 MED ORDER — MIDAZOLAM HCL 2 MG/2ML IJ SOLN
INTRAMUSCULAR | Status: AC
  Filled 2016-07-04: qty 2

## 2016-07-04 MED ORDER — BUPIVACAINE-EPINEPHRINE 0.5% -1:200000 IJ SOLN
INTRAMUSCULAR | Status: DC | PRN
  Administered 2016-07-04: 10 mL

## 2016-07-04 MED ORDER — HYDROCODONE-ACETAMINOPHEN 5-325 MG PO TABS: 1.0000 | ORAL_TABLET | ORAL | Status: DC | PRN

## 2016-07-04 MED ORDER — SUCCINYLCHOLINE CHLORIDE 20 MG/ML IJ SOLN
INTRAMUSCULAR | Status: DC | PRN
  Administered 2016-07-04: 120 mg via INTRAVENOUS

## 2016-07-04 MED ORDER — PROPOFOL 10 MG/ML IV BOLUS
INTRAVENOUS | Status: AC
  Filled 2016-07-04: qty 20

## 2016-07-04 MED ORDER — ONDANSETRON HCL 4 MG/2ML IJ SOLN
INTRAMUSCULAR | Status: DC | PRN
Start: 2016-07-04 — End: 2016-07-04
  Administered 2016-07-04: 4 mg via INTRAVENOUS

## 2016-07-04 MED ORDER — LABETALOL HCL 5 MG/ML IV SOLN
INTRAVENOUS | Status: DC | PRN
  Administered 2016-07-04: 10 mg via INTRAVENOUS

## 2016-07-04 MED ORDER — LACTATED RINGERS IV SOLN
INTRAVENOUS | Status: DC
  Administered 2016-07-04 (×2): via INTRAVENOUS

## 2016-07-04 MED ORDER — MIDAZOLAM HCL 2 MG/2ML IJ SOLN
INTRAMUSCULAR | Status: DC | PRN
Start: 2016-07-04 — End: 2016-07-04
  Administered 2016-07-04: 2 mg via INTRAVENOUS

## 2016-07-04 MED ORDER — FENTANYL CITRATE (PF) 100 MCG/2ML IJ SOLN
INTRAMUSCULAR | Status: AC
  Filled 2016-07-04: qty 2

## 2016-07-04 MED ORDER — ACETAMINOPHEN 10 MG/ML IV SOLN
INTRAVENOUS | Status: DC | PRN
  Administered 2016-07-04: 1000 mg via INTRAVENOUS

## 2016-07-04 SURGICAL SUPPLY — 28 items
CANISTER SUCT 1200ML W/VALVE (MISCELLANEOUS) IMPLANT
CHLORAPREP W/TINT 26ML (MISCELLANEOUS) ×2 IMPLANT
DERMABOND ADVANCED (GAUZE/BANDAGES/DRESSINGS) ×1
DERMABOND ADVANCED .7 DNX12 (GAUZE/BANDAGES/DRESSINGS) ×1 IMPLANT
DRAPE LAPAROTOMY 100X77 ABD (DRAPES) ×2 IMPLANT
ELECT REM PT RETURN 9FT ADLT (ELECTROSURGICAL) ×2
ELECTRODE REM PT RTRN 9FT ADLT (ELECTROSURGICAL) ×1 IMPLANT
GAUZE SPONGE 4X4 12PLY STRL (GAUZE/BANDAGES/DRESSINGS) IMPLANT
GLOVE BIO SURGEON STRL SZ7.5 (GLOVE) ×2 IMPLANT
GLOVE BIOGEL M 6.5 STRL (GLOVE) ×2 IMPLANT
GLOVE BIOGEL PI IND STRL 7.0 (GLOVE) ×1 IMPLANT
GLOVE BIOGEL PI INDICATOR 7.0 (GLOVE) ×1
GOWN STRL REUS W/ TWL LRG LVL3 (GOWN DISPOSABLE) ×2 IMPLANT
GOWN STRL REUS W/TWL LRG LVL3 (GOWN DISPOSABLE) ×2
KIT RM TURNOVER STRD PROC AR (KITS) ×2 IMPLANT
LABEL OR SOLS (LABEL) ×2 IMPLANT
MESH SYNTHETIC 4X6 SOFT BARD (Mesh General) ×1 IMPLANT
MESH SYNTHETIC SOFT BARD 4X6 (Mesh General) ×1 IMPLANT
NEEDLE HYPO 25X1 1.5 SAFETY (NEEDLE) ×2 IMPLANT
NS IRRIG 500ML POUR BTL (IV SOLUTION) ×2 IMPLANT
PACK BASIN MINOR ARMC (MISCELLANEOUS) ×2 IMPLANT
STAPLER SKIN PROX 35W (STAPLE) IMPLANT
SUT CHROMIC 3 0 SH 27 (SUTURE) ×2 IMPLANT
SUT MNCRL 4-0 (SUTURE) ×1
SUT MNCRL 4-0 27XMFL (SUTURE) ×1
SUT SURGILON 0 30 BLK (SUTURE) ×6 IMPLANT
SUTURE MNCRL 4-0 27XMF (SUTURE) ×1 IMPLANT
SYRINGE 10CC LL (SYRINGE) ×2 IMPLANT

## 2016-07-04 NOTE — Discharge Instructions (Signed)
Take Tylenol or Norco if needed for pain.  Should not drive or do anything dangerous when taking Norco.  May shower.  Avoid straining and heavy lifting.

## 2016-07-04 NOTE — Anesthesia Post-op Follow-up Note (Cosign Needed)
Anesthesia QCDR form completed.        

## 2016-07-04 NOTE — Op Note (Signed)
OPERATIVE REPORT  PREOPERATIVE  DIAGNOSIS: . Ventral hernia  POSTOPERATIVE DIAGNOSIS: . Ventral hernia  PROCEDURE: . Ventral hernia repair  ANESTHESIA:  General  SURGEON: Renda RollsWilton Olyvia Gopal  MD   INDICATIONS: . He has history of bulging in the right upper quadrant of the abdomen. A ventral hernia was demonstrated on physical exam and repair was recommended for definitive treatment.  With the patient on the operating table in the supine position under general endotracheal anesthesia the abdomen was clipped and prepared with ChloraPrep and draped in a sterile manner.  A transversely oriented incision was made in the right upper quadrant and carried down through subcutaneous tissues. Several small bleeding points were cauterized. There was a sac which was dissected free from surrounding structures and separated from the fascial ring defect and reduced back into the abdominal cavity. The fascial ring defect was approximately 3 x 2 cm. There was also another smaller fascial ring defect of approximately 1 cm which was just medial to this. Properitoneal fat was separated from the overlying fascia. Bard soft mesh was cut to create an oval shape of 3 x 5 cm. This was placed into the properitoneal plane underneath both hernia defects and sutured to the overlying fascia with through and through 0 Surgilon sutures. The fascia for both defects was then closed with interrupted0 Surgilon figure-of-eight sutures incorporating each suture into the mesh. The deep fascia and subcutaneous tissues were infiltrated with half percent Sensorcaine with epinephrine. Subcutaneous tissues were approximated with interrupted 3-0 chromic. The skin was closed with running 4-0 Monocryl subcuticular suture and Dermabond. The patient tolerated surgery satisfactorily and was then prepared for transfer to the recovery room.  Renda RollsWilton Meiko Ives M.D.

## 2016-07-04 NOTE — Anesthesia Procedure Notes (Signed)
Procedure Name: Intubation Date/Time: 07/04/2016 10:35 AM Performed by: [facility] Pre-anesthesia Checklist: Patient identified, Emergency Drugs available, Suction available and Patient being monitored Patient Re-evaluated:Patient Re-evaluated prior to inductionOxygen Delivery Method: Circle system utilized Preoxygenation: Pre-oxygenation with 100% oxygen Intubation Type: IV induction Ventilation: Oral airway inserted - appropriate to patient size Laryngoscope Size: Mac and 3 Grade View: Grade I Tube type: Oral Tube size: 7.0 mm Number of attempts: 1 Airway Equipment and Method: Stylet Placement Confirmation: ETT inserted through vocal cords under direct vision,  positive ETCO2,  CO2 detector and breath sounds checked- equal and bilateral Secured at: 21 cm Dental Injury: Teeth and Oropharynx as per pre-operative assessment

## 2016-07-04 NOTE — H&P (Signed)
  He comes in today prepared for ventral hernia repair  History and physical was reviewed.  Lab work reviewed.  The hernia was identified in the right upper quadrant. The right side was marked YES  I discussed the plan for ventral hernia repair

## 2016-07-04 NOTE — Transfer of Care (Signed)
Immediate Anesthesia Transfer of Care Note  Patient: Chad Cardenas  Procedure(s) Performed: Procedure(s): HERNIA REPAIR VENTRAL ADULT (N/A)  Patient Location: PACU  Anesthesia Type:General  Level of Consciousness: sedated  Airway & Oxygen Therapy: Patient Spontanous Breathing and Patient connected to face mask oxygen  Post-op Assessment: Report given to RN and Post -op Vital signs reviewed and stable  Post vital signs: Reviewed and stable  Last Vitals:  Vitals:   07/04/16 0838 07/04/16 1215  BP: (!) 146/94 (!) 118/98  Pulse: 74 83  Resp: 20 11  Temp: 36.5 C 36.3 C    Last Pain:  Vitals:   07/04/16 1215  TempSrc:   PainSc: (P) Asleep         Complications: No apparent anesthesia complications

## 2016-07-04 NOTE — Anesthesia Preprocedure Evaluation (Signed)
Anesthesia Evaluation  Patient identified by MRN, date of birth, ID band Patient awake    Reviewed: Allergy & Precautions, H&P , NPO status , Patient's Chart, lab work & pertinent test results, reviewed documented beta blocker date and time   Airway Mallampati: II  TM Distance: >3 FB Neck ROM: full    Dental  (+) Teeth Intact   Pulmonary neg pulmonary ROS, sleep apnea and Continuous Positive Airway Pressure Ventilation ,    Pulmonary exam normal        Cardiovascular Exercise Tolerance: Good hypertension, negative cardio ROS Normal cardiovascular exam Rhythm:regular Rate:Normal     Neuro/Psych negative neurological ROS  negative psych ROS   GI/Hepatic negative GI ROS, Neg liver ROS, GERD  Medicated,  Endo/Other  negative endocrine ROS  Renal/GU negative Renal ROS  negative genitourinary   Musculoskeletal   Abdominal   Peds  Hematology negative hematology ROS (+)   Anesthesia Other Findings Past Medical History: No date: Arthritis No date: Crohn's disease (HCC) No date: Fatty liver disease, nonalcoholic No date: GERD (gastroesophageal reflux disease) No date: History of kidney stones     Comment: in the past No date: Hypertension No date: Sleep apnea     Comment: Use C-PAP Past Surgical History: 2014: COLON SURGERY     Comment: Partial Colectomy 2014: HERNIA REPAIR     Comment: Ventral Hernia Repair BMI    Body Mass Index:  37.75 kg/m     Reproductive/Obstetrics negative OB ROS                             Anesthesia Physical Anesthesia Plan  ASA: III  Anesthesia Plan: General ETT   Post-op Pain Management:    Induction:   Airway Management Planned:   Additional Equipment:   Intra-op Plan:   Post-operative Plan:   Informed Consent: I have reviewed the patients History and Physical, chart, labs and discussed the procedure including the risks, benefits and  alternatives for the proposed anesthesia with the patient or authorized representative who has indicated his/her understanding and acceptance.   Dental Advisory Given  Plan Discussed with: CRNA  Anesthesia Plan Comments:         Anesthesia Quick Evaluation

## 2016-07-05 ENCOUNTER — Encounter: Payer: Self-pay | Admitting: Surgery

## 2016-07-08 ENCOUNTER — Encounter: Payer: Self-pay | Admitting: Surgery

## 2016-07-09 NOTE — Anesthesia Postprocedure Evaluation (Signed)
Anesthesia Post Note  Patient: Chad Cardenas  Procedure(s) Performed: Procedure(s) (LRB): HERNIA REPAIR VENTRAL ADULT (N/A)  Patient location during evaluation: PACU Anesthesia Type: General Level of consciousness: awake and alert Pain management: pain level controlled Vital Signs Assessment: post-procedure vital signs reviewed and stable Respiratory status: spontaneous breathing, nonlabored ventilation, respiratory function stable and patient connected to nasal cannula oxygen Cardiovascular status: blood pressure returned to baseline and stable Postop Assessment: no signs of nausea or vomiting Anesthetic complications: no     Last Vitals:  Vitals:   07/04/16 1300 07/04/16 1309  BP: (!) 141/93 (!) 141/92  Pulse: 71 72  Resp: 15   Temp: 36.3 C     Last Pain:  Vitals:   07/05/16 0828  TempSrc:   PainSc: 0-No pain                 Yevette EdwardsJames G Adams

## 2018-09-30 ENCOUNTER — Other Ambulatory Visit: Payer: Self-pay | Admitting: Nurse Practitioner

## 2018-09-30 DIAGNOSIS — K760 Fatty (change of) liver, not elsewhere classified: Secondary | ICD-10-CM

## 2018-10-08 ENCOUNTER — Ambulatory Visit
Admission: RE | Admit: 2018-10-08 | Discharge: 2018-10-08 | Disposition: A | Discharge status: Home / Self Care | Payer: BC Managed Care – PPO | Source: Ambulatory Visit | Attending: Nurse Practitioner | Admitting: Nurse Practitioner

## 2018-10-08 ENCOUNTER — Other Ambulatory Visit: Payer: Self-pay

## 2018-10-08 DIAGNOSIS — K760 Fatty (change of) liver, not elsewhere classified: Secondary | ICD-10-CM

## 2019-09-14 ENCOUNTER — Other Ambulatory Visit: Payer: Self-pay

## 2019-09-15 ENCOUNTER — Other Ambulatory Visit: Payer: Self-pay

## 2019-09-15 ENCOUNTER — Ambulatory Visit (INDEPENDENT_AMBULATORY_CARE_PROVIDER_SITE_OTHER): Payer: BC Managed Care – PPO | Admitting: Gastroenterology

## 2019-09-15 ENCOUNTER — Encounter: Payer: Self-pay | Admitting: Gastroenterology

## 2019-09-15 VITALS — BP 135/93 | HR 97 | Temp 97.6°F | Ht 67.0 in | Wt 209.1 lb

## 2019-09-15 DIAGNOSIS — K760 Fatty (change of) liver, not elsewhere classified: Secondary | ICD-10-CM | POA: Insufficient documentation

## 2019-09-15 DIAGNOSIS — K50112 Crohn's disease of large intestine with intestinal obstruction: Secondary | ICD-10-CM

## 2019-09-15 DIAGNOSIS — E785 Hyperlipidemia, unspecified: Secondary | ICD-10-CM | POA: Insufficient documentation

## 2019-09-15 DIAGNOSIS — Z9049 Acquired absence of other specified parts of digestive tract: Secondary | ICD-10-CM

## 2019-09-15 DIAGNOSIS — E559 Vitamin D deficiency, unspecified: Secondary | ICD-10-CM | POA: Insufficient documentation

## 2019-09-15 MED ORDER — CHOLESTYRAMINE 4 G PO PACK: 4.0000 g | PACK | Freq: Two times a day (BID) | ORAL | 0 refills | Status: DC

## 2019-09-15 NOTE — Progress Notes (Signed)
Chad Darby, MD [address]  [place]  [place], [place] [address]  Main: [phone]  Fax: [phone]    Gastroenterology Consultation  Referring Provider:     Adin Hector, MD Primary Care Physician:  Adin Hector, MD Primary Gastroenterologist:  Dr. Vira Agar Reason for Consultation: Crohn's disease        HPI:   Chad Cardenas is a 64 y.o. male referred by Dr. Caryl Comes, Wendelyn Breslow III, MD  for consultation & management of Crohn's disease  Patient has history of colonic Crohn's diagnosed in 42s s/p partial colectomy, currently maintained on Humira biweekly, in clinical remission.  Patient switched his care from [facility] clinic gastroenterology to [facility].  Patient reports his normal state of health, tolerating Humira well, reports having chronic diarrhea since colectomy.  He has not tried any Imodium.  Crohn's disease classification:  Age: 64 to 70 Location:  colonic Behavior:  stricturing  Perianal: no  IBD diagnosis:1980s   Disease course: He had partial colectomy of the ascending/transverse colon at [facility], then left colectomy 2014 along with ventral hernia repair & lysis of adhesions.  Patient has been maintained on Humira for more than 10 years, biweekly, reports doing well.  He has tolerated Humira well.  He had shingles about 6 months ago.  Currently, experiencing 5-6 nonbloody bowel movements per day and 1 at night since he had partial colectomy. No evidence of anemia, normal inflammatory markers, fecal calprotectin level was 48 in 08/2018.  Celiac serologies negative.  Immune to hepatitis A and B  Extra intestinal manifestations: None  IBD surgical history: Partial colectomy of the ascending and transverse colon in 1992, left colectomy in 2014 with ventral hernia repair and lysis of adhesions  Imaging:  MRE none CTE none SBFT none  Procedures:  Endoscopies done 01/07/19. EGD: reflux esophagitis. Small hiatal hernia. Mild gastritis.  No barretts, dysplasia, malignancy. Colonoscopy: ulcerated ileocolonic anastomosis. Ileum otherwise appeared normal as did the rest of the colon. Biopsies of the colon were negative with the exception ofacute inflammation but no dysplasia malignancy at the ileocolonic anastomosis. 5y repeat recommended.  VCE none  IBD medications:  Steroids: None 5-ASA: no mesalamine Immunomodulators: AZA, methotrexate none TPMT status unknown Biologics: Anti TNFs: Humira biweekly for more than 15 years Anti Integrins: Ustekinumab: Tofactinib: Clinical trial:   Past Medical History:  Diagnosis Date  . Arthritis   . Crohn's disease ([place])   . Fatty liver disease, nonalcoholic   . GERD (gastroesophageal reflux disease)   . History of kidney stones    in the past  . Hypertension   . Sleep apnea    Use C-PAP    Past Surgical History:  Procedure Laterality Date  . COLON SURGERY  2014   Partial Colectomy  . HERNIA REPAIR  2014   Ventral Hernia Repair  . VENTRAL HERNIA REPAIR N/A 07/04/2016   Procedure: HERNIA REPAIR VENTRAL ADULT;  Surgeon: Leonie Green, MD;  Location: [facility] ORS;  Service: General;  Laterality: N/A;    Current Outpatient Medications:  .  Adalimumab 40 MG/0.4ML PSKT, Inject into the skin., Disp: , Rfl:  .  Cholecalciferol (VITAMIN D3) 2000 UNITS capsule, Take 2,000 Units by mouth. , Disp: , Rfl:  .  hydrochlorothiazide (HYDRODIURIL) 25 MG tablet, Take by mouth., Disp: , Rfl:  .  losartan (COZAAR) 50 MG tablet, Take by mouth., Disp: , Rfl:  .  pantoprazole (PROTONIX) 40 MG tablet, Take 40 mg by mouth daily.,  Disp: , Rfl:  .  cholestyramine (QUESTRAN) 4 g packet, Take 1 packet (4 g total) by mouth 2 (two) times daily., Disp: 60 each, Rfl: 0   Family History  Problem Relation Age of Onset  . Heart attack Mother   . Hypertension Father      Social History   Tobacco Use  . Smoking status: Never Smoker  . Smokeless tobacco: Never Used  Substance Use Topics  .  Alcohol use: No    Alcohol/week: 0.0 standard drinks  . Drug use: No    Allergies as of 09/15/2019  . (No Known Allergies)    Review of Systems:    All systems reviewed and negative except where noted in HPI.   Physical Exam:  BP (!) 135/93 (BP Location: Left Arm, Patient Position: Sitting, Cuff Size: Normal)   Pulse 97   Temp 97.6 F (36.4 C) (Oral)   Ht 5\' 7"  (1.702 m)   Wt 209 lb 2 oz (94.9 kg)   BMI 32.75 kg/m  No LMP for male patient.  General:   Alert,  Well-developed, well-nourished, pleasant and cooperative in NAD Head:  Normocephalic and atraumatic. Eyes:  Sclera clear, no icterus.   Conjunctiva pink. Ears:  Normal auditory acuity. Nose:  No deformity, discharge, or lesions. Mouth:  No deformity or lesions,oropharynx pink & moist. Neck:  Supple; no masses or thyromegaly. Lungs:  Respirations even and unlabored.  Clear throughout to auscultation.   No wheezes, crackles, or rhonchi. No acute distress. Heart:  Regular rate and rhythm; no murmurs, clicks, rubs, or gallops. Abdomen:  Normal bowel sounds. Soft, vertical scar in midline, well-healed, non-tender and non-distended without masses, hepatosplenomegaly or hernias noted.  No guarding or rebound tenderness.   Rectal: Not performed Msk:  Symmetrical without gross deformities. Good, equal movement & strength bilaterally. Pulses:  Normal pulses noted. Extremities:  No clubbing or edema.  No cyanosis. Neurologic:  Alert and oriented x3;  grossly normal neurologically. Skin:  Intact without significant lesions or rashes. No jaundice. Psych:  Alert and cooperative. Normal mood and affect.  Imaging Studies: Reviewed  Assessment and Plan:   Chad Cardenas is a 64 y.o. male with history of hypertension, chronic GERD, Crohn's colitis diagnosed in 64s s/p partial colectomy in 1992 and 2014, currently maintained on Humira biweekly.  Crohn's colitis, s/p ileocolonic anastomosis Continue Humira biweekly,  monotherapy Update copy of colonoscopy and pathology results, 12/2018, reportedly normal except for mild acute inflammation at the ileocolonic anastomosis Trial of cholestyramine 1-2 times daily for chronic diarrhea which is likely secondary to subtotal colectomy Based on his clinical response, we could try increasing Humira to weekly Adalimumab drug levels are subtherapeutic at 4.8, undetectable antibodies as of January 18, 2019  IBD Health Maintenance  1.TB status: QuantiFERON gold negative in 08/2018 2. Anemia: Not present 3.Immunizations: Hep A and B immune, Influenza up-to-date, prevnar unknown, pneumovax unknown, Zoster recommend Shingrix vaccine 4.Cancer screening I) Colon cancer/dysplasia surveillance: Last colonoscopy 12/2018, recommend surveillance in 5 years II) Skin cancer - counseled about annual skin exam by dermatology and skin protection in summer using sun screen SPF > 50, clothing 5.Bone health Vitamin D status: normal  Bone density testing: n/a 5. Labs: Every 3 months 6. Smoking: n/a 7. NSAIDs and Antibiotics use: n/a   Follow up in 2 to 3 months   01/2019, MD

## 2019-09-30 ENCOUNTER — Encounter: Payer: Self-pay | Admitting: Unknown Physician Specialty

## 2019-10-07 ENCOUNTER — Encounter: Payer: Self-pay | Admitting: *Deleted

## 2019-10-07 ENCOUNTER — Other Ambulatory Visit: Payer: Self-pay | Admitting: Gastroenterology

## 2019-10-07 DIAGNOSIS — K50112 Crohn's disease of large intestine with intestinal obstruction: Secondary | ICD-10-CM

## 2019-10-07 DIAGNOSIS — Z9049 Acquired absence of other specified parts of digestive tract: Secondary | ICD-10-CM

## 2019-10-29 ENCOUNTER — Encounter: Payer: Self-pay | Admitting: Gastroenterology

## 2019-10-29 MED ORDER — ADALIMUMAB 40 MG/0.4ML ~~LOC~~ PSKT: 0.4000 mL | PREFILLED_SYRINGE | SUBCUTANEOUS | 1 refills | Status: DC

## 2019-11-04 ENCOUNTER — Other Ambulatory Visit: Payer: Self-pay | Admitting: Gastroenterology

## 2019-11-04 DIAGNOSIS — K50112 Crohn's disease of large intestine with intestinal obstruction: Secondary | ICD-10-CM

## 2019-11-04 DIAGNOSIS — Z9049 Acquired absence of other specified parts of digestive tract: Secondary | ICD-10-CM

## 2019-11-26 ENCOUNTER — Ambulatory Visit: Payer: BC Managed Care – PPO | Admitting: Gastroenterology

## 2019-12-04 ENCOUNTER — Other Ambulatory Visit: Payer: Self-pay | Admitting: Gastroenterology

## 2019-12-04 DIAGNOSIS — K50112 Crohn's disease of large intestine with intestinal obstruction: Secondary | ICD-10-CM

## 2019-12-04 DIAGNOSIS — Z9049 Acquired absence of other specified parts of digestive tract: Secondary | ICD-10-CM

## 2019-12-06 ENCOUNTER — Other Ambulatory Visit: Payer: Self-pay | Admitting: Gastroenterology

## 2019-12-07 ENCOUNTER — Other Ambulatory Visit: Payer: Self-pay

## 2019-12-07 NOTE — Telephone Encounter (Signed)
Last office visit 09/15/2019 crohn's disease  Last refill 10/29/2019 1 refills  Has appointment 12/09/2019

## 2019-12-09 ENCOUNTER — Other Ambulatory Visit: Payer: Self-pay

## 2019-12-09 ENCOUNTER — Ambulatory Visit (INDEPENDENT_AMBULATORY_CARE_PROVIDER_SITE_OTHER): Payer: BC Managed Care – PPO | Admitting: Gastroenterology

## 2019-12-09 ENCOUNTER — Encounter: Payer: Self-pay | Admitting: Gastroenterology

## 2019-12-09 VITALS — BP 129/82 | HR 81 | Temp 97.6°F | Wt 216.1 lb

## 2019-12-09 DIAGNOSIS — K50111 Crohn's disease of large intestine with rectal bleeding: Secondary | ICD-10-CM | POA: Diagnosis not present

## 2019-12-09 DIAGNOSIS — K219 Gastro-esophageal reflux disease without esophagitis: Secondary | ICD-10-CM | POA: Diagnosis not present

## 2019-12-09 DIAGNOSIS — Z9049 Acquired absence of other specified parts of digestive tract: Secondary | ICD-10-CM | POA: Diagnosis not present

## 2019-12-09 MED ORDER — HUMIRA (2 PEN) 40 MG/0.4ML ~~LOC~~ AJKT: AUTO-INJECTOR | SUBCUTANEOUS | 1 refills | Status: DC

## 2019-12-09 MED ORDER — CLENPIQ 10-3.5-12 MG-GM -GM/160ML PO SOLN: 320.0000 mL | Freq: Every day | ORAL | 0 refills | Status: DC

## 2019-12-09 NOTE — Patient Instructions (Signed)

## 2019-12-09 NOTE — Progress Notes (Signed)
Chad Repress, MD [address]  [address]  [place], [place] [address]  Main: [phone]  Fax: [phone]    Gastroenterology Consultation  Referring Provider:     Lynnea Ferrier, MD Primary Care Physician:  Lynnea Ferrier, MD Primary Gastroenterologist:  Dr. Mechele Collin Reason for Consultation: Crohn's disease        HPI:   Chad Cardenas is a 64 y.o. male referred by Dr. Graciela Husbands, Chad Borders III, MD  for consultation & management of Crohn's disease  Patient has history of colonic Crohn's diagnosed in 71s s/p partial colectomy, currently maintained on Humira biweekly, in clinical remission.  Patient switched his care from [facility] clinic gastroenterology to [facility].  Patient reports his normal state of health, tolerating Humira well, reports having chronic diarrhea since colectomy.  He has not tried any Imodium.  Follow-up visit 12/09/2019 Patient reports he has been doing significantly better with regards to diarrhea.  He is on Questran 4 g twice daily with 50% improvement in frequency, decreased from 8 to 10/day to 4 during the day and 1 at night.  He has been gaining weight.  He is also concerned about acid reflux.  He is currently taking Protonix 40 mg 2 times daily, increased from once a day.  He reports constant clearing of the throat as well as heartburn.  He reports that he had esophageal dilation in the past, is asking for EGD   Crohn's disease classification:  Age: 64 to 81 Location:  colonic Behavior:  stricturing  Perianal: no  IBD diagnosis:1980s   Disease course: He had partial colectomy of the ascending/transverse colon at [facility], then left colectomy 2014 along with ventral hernia repair & lysis of adhesions.  Patient has been maintained on Humira for more than 10 years, biweekly, reports doing well.  He has tolerated Humira well.  He had shingles about 6 months ago.  Currently, experiencing 5-6 nonbloody bowel movements per day and 1 at night  since he had partial colectomy. No evidence of anemia, normal inflammatory markers, fecal calprotectin level was 48 in 08/2018.  Celiac serologies negative.  Immune to hepatitis A and B  Extra intestinal manifestations: None  IBD surgical history: Partial colectomy of the ascending and transverse colon in 1992, left colectomy in 2014 with ventral hernia repair and lysis of adhesions  Imaging:  MRE none CTE none SBFT none  Procedures:  Endoscopies done 01/07/19. EGD: reflux esophagitis. Small hiatal hernia. Mild gastritis. No barretts, dysplasia, malignancy. Colonoscopy: ulcerated ileocolonic anastomosis. Ileum otherwise appeared normal as did the rest of the colon. Biopsies of the colon were negative with the exception ofacute inflammation but no dysplasia malignancy at the ileocolonic anastomosis. 5y repeat recommended.  VCE none  IBD medications:  Steroids: None 5-ASA: no mesalamine Immunomodulators: AZA, methotrexate none TPMT status unknown Biologics: Anti TNFs: Humira biweekly for more than 15 years Anti Integrins: Ustekinumab: Tofactinib: Clinical trial:   Past Medical History:  Diagnosis Date  . Arthritis   . Crohn's disease (HCC)   . Fatty liver disease, nonalcoholic   . GERD (gastroesophageal reflux disease)   . History of kidney stones    in the past  . Hypertension   . Sleep apnea    Use C-PAP    Past Surgical History:  Procedure Laterality Date  . COLON SURGERY  2014   Partial Colectomy  . HERNIA REPAIR  2014   Ventral Hernia Repair  . VENTRAL HERNIA REPAIR N/A 07/04/2016  Procedure: HERNIA REPAIR VENTRAL ADULT;  Surgeon: Nadeen Landau, MD;  Location: [facility] ORS;  Service: General;  Laterality: N/A;    Current Outpatient Medications:  .  Cholecalciferol (VITAMIN D3) 2000 UNITS capsule, Take 2,000 Units by mouth. , Disp: , Rfl:  .  cholestyramine (QUESTRAN) 4 g packet, TAKE 1 PACKET (4 G TOTAL) BY MOUTH 2 (TWO) TIMES DAILY., Disp: 60 packet,  Rfl: 1 .  fenofibrate 160 MG tablet, Take by mouth., Disp: , Rfl:  .  HUMIRA PEN 40 MG/0.4ML PNKT, INJECT 1 PEN UNDER THE SKIN  EVERY 14 DAYS., Disp: 2 each, Rfl: 0 .  hydrochlorothiazide (HYDRODIURIL) 25 MG tablet, Take by mouth., Disp: , Rfl:  .  losartan (COZAAR) 50 MG tablet, Take by mouth., Disp: , Rfl:  .  pantoprazole (PROTONIX) 40 MG tablet, Take 40 mg by mouth daily., Disp: , Rfl:    Family History  Problem Relation Age of Onset  . Heart attack Mother   . Hypertension Father      Social History   Tobacco Use  . Smoking status: Never Smoker  . Smokeless tobacco: Never Used  Substance Use Topics  . Alcohol use: No    Alcohol/week: 0.0 standard drinks  . Drug use: No    Allergies as of 12/09/2019  . (No Known Allergies)    Review of Systems:    All systems reviewed and negative except where noted in HPI.   Physical Exam:  BP 129/82 (BP Location: Left Arm, Patient Position: Sitting, Cuff Size: Normal)   Pulse 81   Temp 97.6 F (36.4 C) (Oral)   Wt 216 lb 2 oz (98 kg)   BMI 33.85 kg/m  No LMP for male patient.  General:   Alert,  Well-developed, well-nourished, pleasant and cooperative in NAD Head:  Normocephalic and atraumatic. Eyes:  Sclera clear, no icterus.   Conjunctiva pink. Ears:  Normal auditory acuity. Nose:  No deformity, discharge, or lesions. Mouth:  No deformity or lesions,oropharynx pink & moist. Neck:  Supple; no masses or thyromegaly. Lungs:  Respirations even and unlabored.  Clear throughout to auscultation.   No wheezes, crackles, or rhonchi. No acute distress. Heart:  Regular rate and rhythm; no murmurs, clicks, rubs, or gallops. Abdomen:  Normal bowel sounds. Soft, vertical scar in midline, well-healed, non-tender and non-distended without masses, hepatosplenomegaly or hernias noted.  No guarding or rebound tenderness.   Rectal: Not performed Msk:  Symmetrical without gross deformities. Good, equal movement & strength bilaterally. Pulses:   Normal pulses noted. Extremities:  No clubbing or edema.  No cyanosis. Neurologic:  Alert and oriented x3;  grossly normal neurologically. Skin:  Intact without significant lesions or rashes. No jaundice. Psych:  Alert and cooperative. Normal mood and affect.  Imaging Studies: Reviewed  Assessment and Plan:   BRANSEN FASSNACHT is a 64 y.o. male with history of hypertension, chronic GERD, Crohn's colitis diagnosed in 67s s/p partial colectomy in 1992 and 2014, currently maintained on Humira biweekly.  Crohn's colitis, s/p ileocolonic anastomosis Continue Humira biweekly, monotherapy Continue cholestyramine 2 times daily for chronic diarrhea which is likely secondary to subtotal colectomy Recommend colonoscopy to evaluate for any active colitis with biopsies Adalimumab drug levels are subtherapeutic at 4.8, undetectable antibodies as of January 18, 2019 Recheck Humira trough level  Chronic GERD Secondary to weight gain and eating habits Discussed about antireflux lifestyle, information provided Continue Protonix 40 mg twice daily before meals Recommend EGD for further evaluation   IBD Health Maintenance  1.TB status: QuantiFERON gold negative in 08/2018 2. Anemia: Not present 3.Immunizations: Hep A and B immune, Influenza up-to-date, prevnar unknown, pneumovax unknown, Zoster recommend Shingrix vaccine 4.Cancer screening I) Colon cancer/dysplasia surveillance: Last colonoscopy 12/2018, repeat colonoscopy to assess inflammation II) Skin cancer - counseled about annual skin exam by dermatology and skin protection in summer using sun screen SPF > 50, clothing 5.Bone health Vitamin D status: normal  Bone density testing: n/a 5. Labs: Every 3 months 6. Smoking: n/a 7. NSAIDs and Antibiotics use: n/a   Follow up in 3 months   Chad Repress, MD

## 2019-12-09 NOTE — Addendum Note (Signed)
Addended by: Radene Knee L on: 12/09/2019 02:25 PM   Modules accepted: Orders

## 2019-12-09 NOTE — Addendum Note (Signed)
Addended by: Radene Knee L on: 12/09/2019 02:17 PM   Modules accepted: Orders

## 2019-12-21 ENCOUNTER — Encounter: Payer: Self-pay | Admitting: Gastroenterology

## 2019-12-26 LAB — ADALIMUMAB+AB (SERIAL MONITOR)
Adalimumab Drug Level: 3.8 ug/mL
Anti-Adalimumab Antibody: <25 ng/mL

## 2019-12-26 LAB — SERIAL MONITORING

## 2019-12-28 ENCOUNTER — Other Ambulatory Visit
Admission: RE | Admit: 2019-12-28 | Discharge: 2019-12-28 | Disposition: A | Discharge status: Home / Self Care | Payer: BC Managed Care – PPO | Source: Ambulatory Visit | Attending: Gastroenterology | Admitting: Gastroenterology

## 2019-12-28 ENCOUNTER — Other Ambulatory Visit: Payer: Self-pay

## 2019-12-28 DIAGNOSIS — Z01812 Encounter for preprocedural laboratory examination: Secondary | ICD-10-CM | POA: Insufficient documentation

## 2019-12-28 DIAGNOSIS — Z20822 Contact with and (suspected) exposure to covid-19: Secondary | ICD-10-CM | POA: Diagnosis not present

## 2019-12-28 LAB — SARS CORONAVIRUS 2 (TAT 6-24 HRS): SARS Coronavirus 2: NEGATIVE

## 2019-12-29 NOTE — Anesthesia Preprocedure Evaluation (Addendum)
Anesthesia Evaluation  Patient identified by MRN, date of birth, ID band Patient awake    Reviewed: Allergy & Precautions, NPO status , Patient's Chart, lab work & pertinent test results, reviewed documented beta blocker date and time   History of Anesthesia Complications Negative for: history of anesthetic complications  Airway Mallampati: III  TM Distance: >3 FB Neck ROM: Full    Dental   Pulmonary sleep apnea ,    breath sounds clear to auscultation       Cardiovascular hypertension, (-) angina(-) DOE  Rhythm:Regular Rate:Normal   HLD   Neuro/Psych    GI/Hepatic GERD  Controlled, NASH  Chron's   Endo/Other    Renal/GU Renal disease (Stones)     Musculoskeletal  (+) Arthritis ,   Abdominal   Peds  Hematology   Anesthesia Other Findings   Reproductive/Obstetrics                            Anesthesia Physical Anesthesia Plan  ASA: II  Anesthesia Plan: General   Post-op Pain Management:    Induction: Intravenous  PONV Risk Score and Plan: 2 and Propofol infusion, TIVA and Treatment may vary due to age or medical condition  Airway Management Planned: Natural Airway and Nasal Cannula  Additional Equipment:   Intra-op Plan:   Post-operative Plan:   Informed Consent: I have reviewed the patients History and Physical, chart, labs and discussed the procedure including the risks, benefits and alternatives for the proposed anesthesia with the patient or authorized representative who has indicated his/her understanding and acceptance.       Plan Discussed with: CRNA and Anesthesiologist  Anesthesia Plan Comments:        Anesthesia Quick Evaluation

## 2019-12-29 NOTE — Discharge Instructions (Signed)
General Anesthesia, Adult, Care After This sheet gives you information about how to care for yourself after your procedure. Your health care provider may also give you more specific instructions. If you have problems or questions, contact your health care provider. What can I expect after the procedure? After the procedure, the following side effects are common:  Pain or discomfort at the IV site.  Nausea.  Vomiting.  Sore throat.  Trouble concentrating.  Feeling cold or chills.  Weak or tired.  Sleepiness and fatigue.  Soreness and body aches. These side effects can affect parts of the body that were not involved in surgery. Follow these instructions at home:  For at least 24 hours after the procedure:  Have a responsible adult stay with you. It is important to have someone help care for you until you are awake and alert.  Rest as needed.  Do not: ? Participate in activities in which you could fall or become injured. ? Drive. ? Use heavy machinery. ? Drink alcohol. ? Take sleeping pills or medicines that cause drowsiness. ? Make important decisions or sign legal documents. ? Take care of children on your own. Eating and drinking  Follow any instructions from your health care provider about eating or drinking restrictions.  When you feel hungry, start by eating small amounts of foods that are soft and easy to digest (bland), such as toast. Gradually return to your regular diet.  Drink enough fluid to keep your urine pale yellow.  If you vomit, rehydrate by drinking water, juice, or clear broth. General instructions  If you have sleep apnea, surgery and certain medicines can increase your risk for breathing problems. Follow instructions from your health care provider about wearing your sleep device: ? Anytime you are sleeping, including during daytime naps. ? While taking prescription pain medicines, sleeping medicines, or medicines that make you drowsy.  Return to  your normal activities as told by your health care provider. Ask your health care provider what activities are safe for you.  Take over-the-counter and prescription medicines only as told by your health care provider.  If you smoke, do not smoke without supervision.  Keep all follow-up visits as told by your health care provider. This is important. Contact a health care provider if:  You have nausea or vomiting that does not get better with medicine.  You cannot eat or drink without vomiting.  You have pain that does not get better with medicine.  You are unable to pass urine.  You develop a skin rash.  You have a fever.  You have redness around your IV site that gets worse. Get help right away if:  You have difficulty breathing.  You have chest pain.  You have blood in your urine or stool, or you vomit blood. Summary  After the procedure, it is common to have a sore throat or nausea. It is also common to feel tired.  Have a responsible adult stay with you for the first 24 hours after general anesthesia. It is important to have someone help care for you until you are awake and alert.  When you feel hungry, start by eating small amounts of foods that are soft and easy to digest (bland), such as toast. Gradually return to your regular diet.  Drink enough fluid to keep your urine pale yellow.  Return to your normal activities as told by your health care provider. Ask your health care provider what activities are safe for you. This information is not   intended to replace advice given to you by your health care provider. Make sure you discuss any questions you have with your health care provider. Document Revised: 04/04/2017 Document Reviewed: 11/15/2016 Elsevier Patient Education  2020 Elsevier Inc.  

## 2019-12-30 ENCOUNTER — Other Ambulatory Visit: Payer: Self-pay

## 2019-12-30 ENCOUNTER — Encounter: Payer: Self-pay | Admitting: Gastroenterology

## 2019-12-30 ENCOUNTER — Ambulatory Visit: Payer: BC Managed Care – PPO | Admitting: Anesthesiology

## 2019-12-30 ENCOUNTER — Telehealth: Payer: Self-pay

## 2019-12-30 ENCOUNTER — Encounter
Admission: RE | Disposition: A | Discharge status: Home / Self Care | Payer: Self-pay | Source: Home / Self Care | Attending: Gastroenterology

## 2019-12-30 ENCOUNTER — Ambulatory Visit
Admission: RE | Admit: 2019-12-30 | Discharge: 2019-12-30 | Disposition: A | Discharge status: Home / Self Care | Payer: BC Managed Care – PPO | Attending: Gastroenterology | Admitting: Gastroenterology

## 2019-12-30 DIAGNOSIS — K5010 Crohn's disease of large intestine without complications: Secondary | ICD-10-CM | POA: Diagnosis present

## 2019-12-30 DIAGNOSIS — K2100 Gastro-esophageal reflux disease with esophagitis, without bleeding: Secondary | ICD-10-CM | POA: Insufficient documentation

## 2019-12-30 DIAGNOSIS — Z79899 Other long term (current) drug therapy: Secondary | ICD-10-CM | POA: Diagnosis not present

## 2019-12-30 DIAGNOSIS — Z8249 Family history of ischemic heart disease and other diseases of the circulatory system: Secondary | ICD-10-CM | POA: Diagnosis not present

## 2019-12-30 DIAGNOSIS — K6389 Other specified diseases of intestine: Secondary | ICD-10-CM | POA: Insufficient documentation

## 2019-12-30 DIAGNOSIS — K228 Other specified diseases of esophagus: Secondary | ICD-10-CM | POA: Diagnosis not present

## 2019-12-30 DIAGNOSIS — K760 Fatty (change of) liver, not elsewhere classified: Secondary | ICD-10-CM | POA: Insufficient documentation

## 2019-12-30 DIAGNOSIS — Z980 Intestinal bypass and anastomosis status: Secondary | ICD-10-CM | POA: Diagnosis not present

## 2019-12-30 DIAGNOSIS — I10 Essential (primary) hypertension: Secondary | ICD-10-CM | POA: Insufficient documentation

## 2019-12-30 DIAGNOSIS — K50111 Crohn's disease of large intestine with rectal bleeding: Secondary | ICD-10-CM | POA: Diagnosis not present

## 2019-12-30 DIAGNOSIS — K529 Noninfective gastroenteritis and colitis, unspecified: Secondary | ICD-10-CM | POA: Diagnosis not present

## 2019-12-30 DIAGNOSIS — M1990 Unspecified osteoarthritis, unspecified site: Secondary | ICD-10-CM | POA: Insufficient documentation

## 2019-12-30 DIAGNOSIS — K219 Gastro-esophageal reflux disease without esophagitis: Secondary | ICD-10-CM | POA: Diagnosis present

## 2019-12-30 DIAGNOSIS — G4730 Sleep apnea, unspecified: Secondary | ICD-10-CM | POA: Insufficient documentation

## 2019-12-30 HISTORY — PX: ESOPHAGOGASTRODUODENOSCOPY (EGD) WITH PROPOFOL: SHX5813

## 2019-12-30 HISTORY — PX: COLONOSCOPY WITH PROPOFOL: SHX5780

## 2019-12-30 SURGERY — COLONOSCOPY WITH PROPOFOL
Anesthesia: General | Site: Rectum

## 2019-12-30 MED ORDER — STERILE WATER FOR IRRIGATION IR SOLN: Status: DC | PRN

## 2019-12-30 MED ORDER — SODIUM CHLORIDE 0.9 % IV SOLN: INTRAVENOUS | Status: DC

## 2019-12-30 MED ORDER — ACETAMINOPHEN 10 MG/ML IV SOLN: 1000.0000 mg | Freq: Once | INTRAVENOUS | Status: DC | PRN

## 2019-12-30 MED ORDER — PROPOFOL 10 MG/ML IV BOLUS
INTRAVENOUS | Status: DC | PRN
  Administered 2019-12-30 (×3): 40 mg via INTRAVENOUS
  Administered 2019-12-30: 180 mg via INTRAVENOUS
  Administered 2019-12-30: 40 mg via INTRAVENOUS

## 2019-12-30 MED ORDER — LIDOCAINE HCL (CARDIAC) PF 100 MG/5ML IV SOSY
PREFILLED_SYRINGE | INTRAVENOUS | Status: DC | PRN
  Administered 2019-12-30: 30 mg via INTRAVENOUS

## 2019-12-30 MED ORDER — GLYCOPYRROLATE 0.2 MG/ML IJ SOLN
INTRAMUSCULAR | Status: DC | PRN
  Administered 2019-12-30: .1 mg via INTRAVENOUS

## 2019-12-30 MED ORDER — HUMIRA-CD/UC/HS STARTER 80 MG/0.8ML ~~LOC~~ AJKT: AUTO-INJECTOR | SUBCUTANEOUS | 0 refills | Status: DC

## 2019-12-30 MED ORDER — LACTATED RINGERS IV SOLN: INTRAVENOUS | Status: DC

## 2019-12-30 MED ORDER — ONDANSETRON HCL 4 MG/2ML IJ SOLN: 4.0000 mg | Freq: Once | INTRAMUSCULAR | Status: DC | PRN

## 2019-12-30 MED ORDER — HUMIRA (2 PEN) 40 MG/0.4ML ~~LOC~~ AJKT: 40.0000 mg | AUTO-INJECTOR | SUBCUTANEOUS | 1 refills | Status: DC

## 2019-12-30 SURGICAL SUPPLY — 7 items
BLOCK BITE 60FR ADLT L/F GRN (MISCELLANEOUS) ×4 IMPLANT
FORCEPS BIOP RAD 4 LRG CAP 4 (CUTTING FORCEPS) ×4 IMPLANT
GOWN CVR UNV OPN BCK APRN NK (MISCELLANEOUS) ×4 IMPLANT
GOWN ISOL THUMB LOOP REG UNIV (MISCELLANEOUS) ×8
KIT ENDO PROCEDURE OLY (KITS) ×4 IMPLANT
MANIFOLD NEPTUNE II (INSTRUMENTS) ×4 IMPLANT
WATER STERILE IRR 250ML POUR (IV SOLUTION) ×4 IMPLANT

## 2019-12-30 NOTE — Op Note (Signed)
[facility] Gastroenterology Patient Name: Chad Cardenas Procedure Date: 12/30/2019 8:58 AM MRN: [id number] Account #: [id number] Date of Birth: [birth year] Admit Type: Outpatient Age: 64 Room: [facility] OR ROOM 01 Gender: Male Note Status: Finalized Procedure:             Upper GI endoscopy Indications:           Screening for Barrett's esophagus, Follow-up of                         gastro-esophageal reflux disease Providers:             Lin Landsman MD, MD Referring MD:          Ramonita Lab, MD (Referring MD) Medicines:             Monitored Anesthesia Care Complications:         No immediate complications. Estimated blood loss: None. Procedure:             Pre-Anesthesia Assessment:                        - Prior to the procedure, a History and Physical was                         performed, and patient medications and allergies were                         reviewed. The patient is competent. The risks and                         benefits of the procedure and the sedation options and                         risks were discussed with the patient. All questions                         were answered and informed consent was obtained.                         Patient identification and proposed procedure were                         verified by the physician, the nurse, the                         anesthesiologist, the anesthetist and the technician                         in the pre-procedure area in the procedure room in the                         endoscopy suite. Mental Status Examination: alert and                         oriented. Airway Examination: normal oropharyngeal                         airway and neck mobility. Respiratory Examination:  clear to auscultation. CV Examination: normal.                         Prophylactic Antibiotics: The patient does not require                         prophylactic antibiotics. Prior  Anticoagulants: The                         patient has taken no previous anticoagulant or                         antiplatelet agents. ASA Grade Assessment: II - A                         patient with mild systemic disease. After reviewing                         the risks and benefits, the patient was deemed in                         satisfactory condition to undergo the procedure. The                         anesthesia plan was to use monitored anesthesia care                         (MAC). Immediately prior to administration of                         medications, the patient was re-assessed for adequacy                         to receive sedatives. The heart rate, respiratory                         rate, oxygen saturations, blood pressure, adequacy of                         pulmonary ventilation, and response to care were                         monitored throughout the procedure. The physical                         status of the patient was re-assessed after the                         procedure.                        After obtaining informed consent, the endoscope was                         passed under direct vision. Throughout the procedure,                         the patient's blood pressure, pulse, and oxygen  saturations were monitored continuously. The Endoscope                         was introduced through the mouth, and advanced to the                         second part of duodenum. The upper GI endoscopy was                         accomplished without difficulty. The patient tolerated                         the procedure well. Findings:      The examined duodenum was normal.      The entire examined stomach was normal.      The cardia and gastric fundus were normal on retroflexion.      Esophagogastric landmarks were identified: the gastroesophageal junction       was found at 39 cm from the incisors.      Scattered islands of  salmon-colored mucosa were present from 38 to 39       cm. No other visible abnormalities were present. Biopsies were taken       with a cold forceps for histology.      The examined esophagus was normal. Impression:            - Normal examined duodenum.                        - Normal stomach.                        - Esophagogastric landmarks identified.                        - Salmon-colored mucosa suspicious for Barrett's                         esophagus. Biopsied.                        - Normal esophagus. Recommendation:        - Await pathology results.                        - Follow an antireflux regimen.                        - Use Protonix (pantoprazole) 40 mg PO BID.                        - Return to my office as previously scheduled.                        - Proceed with colonoscopy as scheduled                        See colonoscopy report Procedure Code(s):     --- Professional ---                        [phone], Esophagogastroduodenoscopy, flexible,  transoral; with biopsy, single or multiple Diagnosis Code(s):     --- Professional ---                        K22.8, Other specified diseases of esophagus                        Z13.810, Encounter for screening for upper                         gastrointestinal disorder                        K21.9, Gastro-esophageal reflux disease without                         esophagitis CPT copyright 2019 American Medical Association. All rights reserved. The codes documented in this report are preliminary and upon coder review may  be revised to meet current compliance requirements. Dr. Ulyess Mort Lin Landsman MD, MD 12/30/2019 9:19:06 AM This report has been signed electronically. Number of Addenda: 0 Note Initiated On: 12/30/2019 8:58 AM Estimated Blood Loss:  Estimated blood loss: none.      [facility]

## 2019-12-30 NOTE — Anesthesia Postprocedure Evaluation (Signed)
Anesthesia Post Note  Patient: Chad Cardenas  Procedure(s) Performed: COLONOSCOPY WITH BIOPSY (N/A Rectum) ESOPHAGOGASTRODUODENOSCOPY (EGD) WITH BIOPSY (N/A Mouth)     Patient location during evaluation: PACU Anesthesia Type: General Level of consciousness: awake and alert Pain management: pain level controlled Vital Signs Assessment: post-procedure vital signs reviewed and stable Respiratory status: spontaneous breathing, nonlabored ventilation, respiratory function stable and patient connected to nasal cannula oxygen Cardiovascular status: blood pressure returned to baseline and stable Postop Assessment: no apparent nausea or vomiting Anesthetic complications: no   No complications documented.  Daimien Patmon A  Sujey Gundry

## 2019-12-30 NOTE — Transfer of Care (Signed)
Immediate Anesthesia Transfer of Care Note  Patient: Chad Cardenas  Procedure(s) Performed: COLONOSCOPY WITH BIOPSY (N/A Rectum) ESOPHAGOGASTRODUODENOSCOPY (EGD) WITH BIOPSY (N/A Mouth)  Patient Location: PACU  Anesthesia Type: General  Level of Consciousness: awake, alert  and patient cooperative  Airway and Oxygen Therapy: Patient Spontanous Breathing and Patient connected to supplemental oxygen  Post-op Assessment: Post-op Vital signs reviewed, Patient's Cardiovascular Status Stable, Respiratory Function Stable, Patent Airway and No signs of Nausea or vomiting  Post-op Vital Signs: Reviewed and stable  Complications: No complications documented.

## 2019-12-30 NOTE — Op Note (Signed)
[facility] Gastroenterology Patient Name: Chad Cardenas Procedure Date: 12/30/2019 8:59 AM MRN: [id number] Account #: [id number] Date of Birth: 1955/10/15 Admit Type: Outpatient Age: 64 Room: [facility] OR ROOM 01 Gender: Male Note Status: Finalized Procedure:             Colonoscopy Indications:           Last colonoscopy: April 2015, Follow-up of Crohn's                         disease of the colon, Assess therapeutic response to                         therapy of Crohn's disease of the colon Providers:             Lin Landsman MD, MD Medicines:             Monitored Anesthesia Care Complications:         No immediate complications. Estimated blood loss: None. Procedure:             Pre-Anesthesia Assessment:                        - Prior to the procedure, a History and Physical was                         performed, and patient medications and allergies were                         reviewed. The patient is competent. The risks and                         benefits of the procedure and the sedation options and                         risks were discussed with the patient. All questions                         were answered and informed consent was obtained.                         Patient identification and proposed procedure were                         verified by the physician, the nurse, the                         anesthesiologist, the anesthetist and the technician                         in the pre-procedure area in the procedure room in the                         endoscopy suite. Mental Status Examination: alert and                         oriented. Airway Examination: normal oropharyngeal                         airway  and neck mobility. Respiratory Examination:                         clear to auscultation. CV Examination: normal.                         Prophylactic Antibiotics: The patient does not require                         prophylactic  antibiotics. Prior Anticoagulants: The                         patient has taken no previous anticoagulant or                         antiplatelet agents. ASA Grade Assessment: II - A                         patient with mild systemic disease. After reviewing                         the risks and benefits, the patient was deemed in                         satisfactory condition to undergo the procedure. The                         anesthesia plan was to use monitored anesthesia care                         (MAC). Immediately prior to administration of                         medications, the patient was re-assessed for adequacy                         to receive sedatives. The heart rate, respiratory                         rate, oxygen saturations, blood pressure, adequacy of                         pulmonary ventilation, and response to care were                         monitored throughout the procedure. The physical                         status of the patient was re-assessed after the                         procedure.                        After obtaining informed consent, the colonoscope was                         passed under direct vision. Throughout the procedure,  the patient's blood pressure, pulse, and oxygen                         saturations were monitored continuously. The was                         introduced through the anus and advanced to the the                         ileocolonic anastomosis. The colonoscopy was performed                         without difficulty. The patient tolerated the                         procedure well. The quality of the bowel preparation                         was good. Findings:      The perianal and digital rectal examinations were normal. Pertinent       negatives include normal sphincter tone and no palpable rectal lesions.      There was evidence of a prior functional end-to-end ileo-colonic        anastomosis at 40 cm proximal to the anus. This was patent and was       characterized by inflammation. The anastomosis was traversed.      Multiple patchy non-bleeding aphthae and small superficial ulcers were       found in the entire colon, worse in rectum and at the anastamosis. No       stigmata of recent bleeding were seen. This was biopsied with a cold       forceps for histology.      Patchy moderate inflammation characterized by congestion (edema),       friability and shallow ulcerations was found in the rectum.      The neo-terminal ileum appeared normal. Impression:            - Patent functional end-to-end ileo-colonic                         anastomosis, characterized by inflammation.                        - Aphtha in the entire examined colon. Biopsied.                        - Patchy moderate inflammation was found in the rectum                         secondary to proctitis.                        - The examined portion of the ileum was normal. Recommendation:        - Discharge patient to home (with escort).                        - Resume previous diet today.                        - Continue present medications.                        -  Await pathology results.                        - Return to my office as previously scheduled. Procedure Code(s):     --- Professional ---                        [phone], Colonoscopy, flexible; with biopsy, single or                         multiple Diagnosis Code(s):     --- Professional ---                        Z98.0, Intestinal bypass and anastomosis status                        K63.89, Other specified diseases of intestine                        K62.89, Other specified diseases of anus and rectum                        K50.10, Crohn's disease of large intestine without                         complications CPT copyright 2019 American Medical Association. All rights reserved. The codes documented in this report are preliminary  and upon coder review may  be revised to meet current compliance requirements. Dr. Ulyess Mort Lin Landsman MD, MD 12/30/2019 9:36:09 AM This report has been signed electronically. Number of Addenda: 0 Note Initiated On: 12/30/2019 8:59 AM Scope Withdrawal Time: 0 hours 5 minutes 53 seconds  Total Procedure Duration: 0 hours 7 minutes 12 seconds  Estimated Blood Loss:  Estimated blood loss: none. Estimated blood loss:                         none. Estimated blood loss: none.      [facility]

## 2019-12-30 NOTE — Addendum Note (Signed)
Addended by: Radene Knee L on: 12/30/2019 01:48 PM   Modules accepted: Orders

## 2019-12-30 NOTE — Telephone Encounter (Signed)
Colonoscopy today revealed colitis in rest of the colon, worse in rectum. Pathology is pending  Recommend GI profile PCR to rule out infection Fecal calprotectin levels Apply for Humira reinduction and increase maintenance dose to once a week  [name], MD [address]  [address]  [place], [place] [address]  Main: [phone]  Fax: [phone] Pager: [phone]

## 2019-12-30 NOTE — Telephone Encounter (Signed)
Order labs per Dr. Allegra Lai request. Called and left a message for call back to inform patient of this information

## 2019-12-30 NOTE — H&P (Signed)
Chad Repress, MD [address]  [address]  [place], [place] [address]  Main: [phone]  Fax: [phone] Pager: [phone]  Primary Care Physician:  Chad Ferrier, MD Primary Gastroenterologist:  Dr. Arlyss Cardenas  Pre-Procedure History & Physical: HPI:  Chad Cardenas is a 64 y.o. male is here for an endoscopy and colonoscopy.   Past Medical History:  Diagnosis Date  . Arthritis   . Crohn's disease (HCC)   . Fatty liver disease, nonalcoholic   . GERD (gastroesophageal reflux disease)   . History of kidney stones    in the past  . Hypertension   . Sleep apnea    Use C-PAP    Past Surgical History:  Procedure Laterality Date  . COLON SURGERY  2014   Partial Colectomy  . HERNIA REPAIR  2014   Ventral Hernia Repair  . VENTRAL HERNIA REPAIR N/A 07/04/2016   Procedure: HERNIA REPAIR VENTRAL ADULT;  Surgeon: Chad Landau, MD;  Location: [facility] ORS;  Service: General;  Laterality: N/A;    Prior to Admission medications   Medication Sig Start Date End Date Taking? Authorizing Provider  Adalimumab (HUMIRA PEN) 40 MG/0.4ML PNKT INJECT 1 PEN UNDER THE SKIN  EVERY 14 DAYS. 12/09/19  Yes Chad Cardenas, Loel Dubonnet, MD  Cholecalciferol (VITAMIN D3) 2000 UNITS capsule Take 2,000 Units by mouth.    Yes [provider]  cholestyramine (QUESTRAN) 4 g packet TAKE 1 PACKET (4 G TOTAL) BY MOUTH 2 (TWO) TIMES DAILY. 12/07/19   Chad Reil, MD  fenofibrate 160 MG tablet Take by mouth. 10/27/19 10/26/20  [provider]  hydrochlorothiazide (HYDRODIURIL) 25 MG tablet Take by mouth. 01/19/19 01/19/20  [provider]  losartan (COZAAR) 50 MG tablet Take by mouth. 05/18/19 05/17/20  [provider]  pantoprazole (PROTONIX) 40 MG tablet Take 40 mg by mouth daily.    [provider]  Sod Picosulfate-Mag Ox-Cit Acd (CLENPIQ) 10-3.5-12 MG-GM -GM/160ML SOLN Take 320 mLs by mouth daily. 12/09/19   Chad Reil, MD    Allergies as of  12/09/2019  . (No Known Allergies)    Family History  Problem Relation Age of Onset  . Heart attack Mother   . Hypertension Father     Social History   Socioeconomic History  . Marital status: Married    Spouse name: Not on file  . Number of children: Not on file  . Years of education: Not on file  . Highest education level: Not on file  Occupational History  . Not on file  Tobacco Use  . Smoking status: Never Smoker  . Smokeless tobacco: Never Used  Substance and Sexual Activity  . Alcohol use: No    Alcohol/week: 0.0 standard drinks  . Drug use: No  . Sexual activity: Not on file  Other Topics Concern  . Not on file  Social History Narrative  . Not on file   Social Determinants of Health   Financial Resource Strain:   . Difficulty of Paying Living Expenses: Not on file  Food Insecurity:   . Worried About Programme researcher, broadcasting/film/video in the Last Year: Not on file  . Ran Out of Food in the Last Year: Not on file  Transportation Needs:   . Lack of Transportation (Medical): Not on file  . Lack of Transportation (Non-Medical): Not on file  Physical Activity:   . Days of Exercise per Week: Not on file  . Minutes of Exercise per Session: Not  on file  Stress:   . Feeling of Stress : Not on file  Social Connections:   . Frequency of Communication with Friends and Family: Not on file  . Frequency of Social Gatherings with Friends and Family: Not on file  . Attends Religious Services: Not on file  . Active Member of Clubs or Organizations: Not on file  . Attends Banker Meetings: Not on file  . Marital Status: Not on file  Intimate Partner Violence:   . Fear of Current or Ex-Partner: Not on file  . Emotionally Abused: Not on file  . Physically Abused: Not on file  . Sexually Abused: Not on file    Review of Systems: See HPI, otherwise negative ROS  Physical Exam: BP (!) 134/94   Pulse 75   Temp 98.1 F (36.7 C) (Temporal)   Ht 5\' 7"  (1.702 m)   Wt  95.7 kg   SpO2 95%   BMI 33.05 kg/m  General:   Alert,  pleasant and cooperative in NAD Head:  Normocephalic and atraumatic. Neck:  Supple; no masses or thyromegaly. Lungs:  Clear throughout to auscultation.    Heart:  Regular rate and rhythm. Abdomen:  Soft, nontender and nondistended. Normal bowel sounds, without guarding, and without rebound.   Neurologic:  Alert and  oriented x4;  grossly normal neurologically.  Impression/Plan: is here for an endoscopy and colonoscopy to be performed for follow up of crohn's, chronic gerd  Risks, benefits, limitations, and alternatives regarding  endoscopy and colonoscopy have been reviewed with the patient.  Questions have been answered.  All parties agreeable.   Chad Coffer, MD  12/30/2019, 8:56 AM

## 2019-12-30 NOTE — Telephone Encounter (Signed)
Per Dr. Allegra Lai patient colonoscopy showed a lot of inflammation. She wanted to send a new induction does to the pharmacy  And change his regular does to every 7 days.  Sent Humira 80mg /0.8 induction does and Humira 40mg /0.4 every 7 days

## 2019-12-30 NOTE — Anesthesia Procedure Notes (Signed)
Date/Time: 12/30/2019 9:05 AM Performed by: Maree Krabbe, CRNA Pre-anesthesia Checklist: Patient identified, Emergency Drugs available, Suction available, Timeout performed and Patient being monitored Patient Re-evaluated:Patient Re-evaluated prior to induction Oxygen Delivery Method: Nasal cannula Placement Confirmation: positive ETCO2

## 2019-12-30 NOTE — Telephone Encounter (Signed)
Patient verbalized understanding. He states he will be waiting on new medication and will go to [facility]

## 2019-12-31 ENCOUNTER — Encounter: Payer: Self-pay | Admitting: Gastroenterology

## 2020-01-01 ENCOUNTER — Other Ambulatory Visit: Payer: Self-pay | Admitting: Gastroenterology

## 2020-01-01 DIAGNOSIS — K50112 Crohn's disease of large intestine with intestinal obstruction: Secondary | ICD-10-CM

## 2020-01-01 DIAGNOSIS — Z9049 Acquired absence of other specified parts of digestive tract: Secondary | ICD-10-CM

## 2020-01-03 LAB — SURGICAL PATHOLOGY

## 2020-01-06 LAB — CALPROTECTIN, FECAL: Calprotectin, Fecal: 183 ug/g — ABNORMAL HIGH (ref 0–120)

## 2020-01-06 LAB — GI PROFILE, STOOL, PCR

## 2020-01-06 LAB — OVA AND PARASITE EXAMINATION

## 2020-01-16 ENCOUNTER — Other Ambulatory Visit: Payer: Self-pay | Admitting: Gastroenterology

## 2020-01-16 DIAGNOSIS — K50112 Crohn's disease of large intestine with intestinal obstruction: Secondary | ICD-10-CM

## 2020-01-16 DIAGNOSIS — Z9049 Acquired absence of other specified parts of digestive tract: Secondary | ICD-10-CM

## 2020-03-30 ENCOUNTER — Ambulatory Visit: Payer: BC Managed Care – PPO | Admitting: Gastroenterology

## 2020-03-31 ENCOUNTER — Other Ambulatory Visit: Payer: Self-pay

## 2020-03-31 ENCOUNTER — Ambulatory Visit: Payer: BC Managed Care – PPO | Admitting: Gastroenterology

## 2020-03-31 ENCOUNTER — Encounter: Payer: Self-pay | Admitting: Gastroenterology

## 2020-03-31 VITALS — BP 150/94 | HR 76 | Temp 97.6°F | Ht 67.0 in | Wt 218.5 lb

## 2020-03-31 DIAGNOSIS — K219 Gastro-esophageal reflux disease without esophagitis: Secondary | ICD-10-CM | POA: Diagnosis not present

## 2020-03-31 DIAGNOSIS — R7989 Other specified abnormal findings of blood chemistry: Secondary | ICD-10-CM | POA: Diagnosis not present

## 2020-03-31 DIAGNOSIS — K50111 Crohn's disease of large intestine with rectal bleeding: Secondary | ICD-10-CM

## 2020-03-31 MED ORDER — PNEUMOCOCCAL 13-VAL CONJ VACC IM SUSP: 0.5000 mL | INTRAMUSCULAR | 0 refills | Status: AC

## 2020-03-31 NOTE — Patient Instructions (Signed)
Fat and Cholesterol Restricted Eating Plan Getting too much fat and cholesterol in your diet may cause health problems. Choosing the right foods helps keep your fat and cholesterol at normal levels. This can keep you from getting certain diseases. Food Choices for Gastroesophageal Reflux Disease, Adult When you have gastroesophageal reflux disease (GERD), the foods you eat and your eating habits are very important. Choosing the right foods can help ease your discomfort. Think about working with a nutrition specialist (dietitian) to help you make good choices. What are tips for following this plan?  Meals  Choose healthy foods that are low in fat, such as fruits, vegetables, whole grains, low-fat dairy products, and lean meat, fish, and poultry.  Eat small meals often instead of 3 large meals a day. Eat your meals slowly, and in a place where you are relaxed. Avoid bending over or lying down until 2-3 hours after eating.  Avoid eating meals 2-3 hours before bed.  Avoid drinking a lot of liquid with meals.  Cook foods using methods other than frying. Bake, grill, or broil food instead.  Avoid or limit: ? Chocolate. ? Peppermint or spearmint. ? Alcohol. ? Pepper. ? Black and decaffeinated coffee. ? Black and decaffeinated tea. ? Bubbly (carbonated) soft drinks. ? Caffeinated energy drinks and soft drinks.  Limit high-fat foods such as: ? Fatty meat or fried foods. ? Whole milk, cream, butter, or ice cream. ? Nuts and nut butters. ? Pastries, donuts, and sweets made with butter or shortening.  Avoid foods that cause symptoms. These foods may be different for everyone. Common foods that cause symptoms include: ? Tomatoes. ? Oranges, lemons, and limes. ? Peppers. ? Spicy food. ? Onions and garlic. ? Vinegar. Lifestyle  Maintain a healthy weight. Ask your doctor what weight is healthy for you. If you need to lose weight, work with your doctor to do so safely.  Exercise for at  least 30 minutes for 5 or more days each week, or as told by your doctor.  Wear loose-fitting clothes.  Do not smoke. If you need help quitting, ask your doctor.  Sleep with the head of your bed higher than your feet. Use a wedge under the mattress or blocks under the bed frame to raise the head of the bed. Summary  When you have gastroesophageal reflux disease (GERD), food and lifestyle choices are very important in easing your symptoms.  Eat small meals often instead of 3 large meals a day. Eat your meals slowly, and in a place where you are relaxed.  Limit high-fat foods such as fatty meat or fried foods.  Avoid bending over or lying down until 2-3 hours after eating.  Avoid peppermint and spearmint, caffeine, alcohol, and chocolate. This information is not intended to replace advice given to you by your health care provider. Make sure you discuss any questions you have with your health care provider. Document Revised: 07/23/2018 Document Reviewed: 05/07/2016 Elsevier Patient Education  2020 [employer].  What are tips for following this plan? Meal planning  At meals, divide your plate into four equal parts: ? Fill one-half of your plate with vegetables and green salads. ? Fill one-fourth of your plate with whole grains. ? Fill one-fourth of your plate with low-fat (lean) protein foods.  Eat fish that is high in omega-3 fats at least two times a week. This includes mackerel, tuna, sardines, and salmon.  Eat foods that are high in fiber, such as whole grains, beans, apples, broccoli, carrots, peas,  and barley. General tips   Work with your doctor to lose weight if you need to.  Avoid: ? Foods with added sugar. ? Fried foods. ? Foods with partially hydrogenated oils.  Limit alcohol intake to no more than 1 drink a day for nonpregnant women and 2 drinks a day for men. One drink equals 12 oz of beer, 5 oz of wine, or 1 oz of hard liquor. Reading food labels  Check food  labels for: ? Trans fats. ? Partially hydrogenated oils. ? Saturated fat (g) in each serving. ? Cholesterol (mg) in each serving. ? Fiber (g) in each serving.  Choose foods with healthy fats, such as: ? Monounsaturated fats. ? Polyunsaturated fats. ? Omega-3 fats.  Choose grain products that have whole grains. Look for the word "whole" as the first word in the ingredient list. Cooking  Cook foods using low-fat methods. These include baking, boiling, grilling, and broiling.  Eat more home-cooked foods. Eat at restaurants and buffets less often.  Avoid cooking using saturated fats, such as butter, cream, palm oil, palm kernel oil, and coconut oil. Recommended foods  Fruits  All fresh, canned (in natural juice), or frozen fruits. Vegetables  Fresh or frozen vegetables (raw, steamed, roasted, or grilled). Green salads. Grains  Whole grains, such as whole wheat or whole grain breads, crackers, cereals, and pasta. Unsweetened oatmeal, bulgur, barley, quinoa, or brown rice. Corn or whole wheat flour tortillas. Meats and other protein foods  Ground beef (85% or leaner), grass-fed beef, or beef trimmed of fat. Skinless chicken or [place]. Ground chicken or [place]. Pork trimmed of fat. All fish and seafood. Egg whites. Dried beans, peas, or lentils. Unsalted nuts or seeds. Unsalted canned beans. Nut butters without added sugar or oil. Dairy  Low-fat or nonfat dairy products, such as skim or 1% milk, 2% or reduced-fat cheeses, low-fat and fat-free ricotta or cottage cheese, or plain low-fat and nonfat yogurt. Fats and oils  Tub margarine without trans fats. Light or reduced-fat mayonnaise and salad dressings. Avocado. Olive, canola, sesame, or safflower oils. The items listed above may not be a complete list of foods and beverages you can eat. Contact a dietitian for more information. Foods to avoid Fruits  Canned fruit in heavy syrup. Fruit in cream or butter sauce. Fried  fruit. Vegetables  Vegetables cooked in cheese, cream, or butter sauce. Fried vegetables. Grains  White bread. White pasta. White rice. Cornbread. Bagels, pastries, and croissants. Crackers and snack foods that contain trans fat and hydrogenated oils. Meats and other protein foods  Fatty cuts of meat. Ribs, chicken wings, bacon, sausage, bologna, salami, chitterlings, fatback, hot dogs, bratwurst, and packaged lunch meats. Liver and organ meats. Whole eggs and egg yolks. Chicken and [place] with skin. Fried meat. Dairy  Whole or 2% milk, cream, half-and-half, and cream cheese. Whole milk cheeses. Whole-fat or sweetened yogurt. Full-fat cheeses. Nondairy creamers and whipped toppings. Processed cheese, cheese spreads, and cheese curds. Beverages  Alcohol. Sugar-sweetened drinks such as sodas, lemonade, and fruit drinks. Fats and oils  Butter, stick margarine, lard, shortening, ghee, or bacon fat. Coconut, palm kernel, and palm oils. Sweets and desserts  Corn syrup, sugars, honey, and molasses. Candy. Jam and jelly. Syrup. Sweetened cereals. Cookies, pies, cakes, donuts, muffins, and ice cream. The items listed above may not be a complete list of foods and beverages you should avoid. Contact a dietitian for more information. Summary  Choosing the right foods helps keep your fat and cholesterol at normal levels.  This can keep you from getting certain diseases.  At meals, fill one-half of your plate with vegetables and green salads.  Eat high-fiber foods, like whole grains, beans, apples, carrots, peas, and barley.  Limit added sugar, saturated fats, alcohol, and fried foods. This information is not intended to replace advice given to you by your health care provider. Make sure you discuss any questions you have with your health care provider. Document Revised: 12/03/2017 Document Reviewed: 12/17/2016 Elsevier Patient Education  2020 [employer] Low-Sodium Eating Plan Sodium, which  is an element that makes up salt, helps you maintain a healthy balance of fluids in your body. Too much sodium can increase your blood pressure and cause fluid and waste to be held in your body. Your health care provider or dietitian may recommend following this plan if you have high blood pressure (hypertension), kidney disease, liver disease, or heart failure. Eating less sodium can help lower your blood pressure, reduce swelling, and protect your heart, liver, and kidneys. What are tips for following this plan? General guidelines  Most people on this plan should limit their sodium intake to 1,500-2,000 mg (milligrams) of sodium each day. Reading food labels   The Nutrition Facts label lists the amount of sodium in one serving of the food. If you eat more than one serving, you must multiply the listed amount of sodium by the number of servings.  Choose foods with less than 140 mg of sodium per serving.  Avoid foods with 300 mg of sodium or more per serving. Shopping  Look for lower-sodium products, often labeled as "low-sodium" or "no salt added."  Always check the sodium content even if foods are labeled as "unsalted" or "no salt added".  Buy fresh foods. ? Avoid canned foods and premade or frozen meals. ? Avoid canned, cured, or processed meats  Buy breads that have less than 80 mg of sodium per slice. Cooking  Eat more home-cooked food and less restaurant, buffet, and fast food.  Avoid adding salt when cooking. Use salt-free seasonings or herbs instead of table salt or sea salt. Check with your health care provider or pharmacist before using salt substitutes.  Cook with plant-based oils, such as canola, sunflower, or olive oil. Meal planning  When eating at a restaurant, ask that your food be prepared with less salt or no salt, if possible.  Avoid foods that contain MSG (monosodium glutamate). MSG is sometimes added to [place] food, bouillon, and some canned foods. What  foods are recommended? The items listed may not be a complete list. Talk with your dietitian about what dietary choices are best for you. Grains Low-sodium cereals, including oats, puffed wheat and rice, and shredded wheat. Low-sodium crackers. Unsalted rice. Unsalted pasta. Low-sodium bread. Whole-grain breads and whole-grain pasta. Vegetables Fresh or frozen vegetables. "No salt added" canned vegetables. "No salt added" tomato sauce and paste. Low-sodium or reduced-sodium tomato and vegetable juice. Fruits Fresh, frozen, or canned fruit. Fruit juice. Meats and other protein foods Fresh or frozen (no salt added) meat, poultry, seafood, and fish. Low-sodium canned tuna and salmon. Unsalted nuts. Dried peas, beans, and lentils without added salt. Unsalted canned beans. Eggs. Unsalted nut butters. Dairy Milk. Soy milk. Cheese that is naturally low in sodium, such as ricotta cheese, fresh mozzarella, or Swiss cheese Low-sodium or reduced-sodium cheese. Cream cheese. Yogurt. Fats and oils Unsalted butter. Unsalted margarine with no trans fat. Vegetable oils such as canola or olive oils. Seasonings and other foods Fresh and dried herbs  and spices. Salt-free seasonings. Low-sodium mustard and ketchup. Sodium-free salad dressing. Sodium-free light mayonnaise. Fresh or refrigerated horseradish. Lemon juice. Vinegar. Homemade, reduced-sodium, or low-sodium soups. Unsalted popcorn and pretzels. Low-salt or salt-free chips. What foods are not recommended? The items listed may not be a complete list. Talk with your dietitian about what dietary choices are best for you. Grains Instant hot cereals. Bread stuffing, pancake, and biscuit mixes. Croutons. Seasoned rice or pasta mixes. Noodle soup cups. Boxed or frozen macaroni and cheese. Regular salted crackers. Self-rising flour. Vegetables Sauerkraut, pickled vegetables, and relishes. Olives. [place] fries. Onion rings. Regular canned vegetables (not low-sodium  or reduced-sodium). Regular canned tomato sauce and paste (not low-sodium or reduced-sodium). Regular tomato and vegetable juice (not low-sodium or reduced-sodium). Frozen vegetables in sauces. Meats and other protein foods Meat or fish that is salted, canned, smoked, spiced, or pickled. Bacon, ham, sausage, hotdogs, corned beef, chipped beef, packaged lunch meats, salt pork, jerky, pickled herring, anchovies, regular canned tuna, sardines, salted nuts. Dairy Processed cheese and cheese spreads. Cheese curds. Blue cheese. Feta cheese. String cheese. Regular cottage cheese. Buttermilk. Canned milk. Fats and oils Salted butter. Regular margarine. Ghee. Bacon fat. Seasonings and other foods Onion salt, garlic salt, seasoned salt, table salt, and sea salt. Canned and packaged gravies. Worcestershire sauce. Tartar sauce. Barbecue sauce. Teriyaki sauce. Soy sauce, including reduced-sodium. Steak sauce. Fish sauce. Oyster sauce. Cocktail sauce. Horseradish that you find on the shelf. Regular ketchup and mustard. Meat flavorings and tenderizers. Bouillon cubes. Hot sauce and Tabasco sauce. Premade or packaged marinades. Premade or packaged taco seasonings. Relishes. Regular salad dressings. Salsa. Potato and tortilla chips. Corn chips and puffs. Salted popcorn and pretzels. Canned or dried soups. Pizza. Frozen entrees and pot pies. Summary  Eating less sodium can help lower your blood pressure, reduce swelling, and protect your heart, liver, and kidneys.  Most people on this plan should limit their sodium intake to 1,500-2,000 mg (milligrams) of sodium each day.  Canned, boxed, and frozen foods are high in sodium. Restaurant foods, fast foods, and pizza are also very high in sodium. You also get sodium by adding salt to food.  Try to cook at home, eat more fresh fruits and vegetables, and eat less fast food, canned, processed, or prepared foods. This information is not intended to replace advice given to  you by your health care provider. Make sure you discuss any questions you have with your health care provider. Document Revised: 03/14/2017 Document Reviewed: 03/25/2016 Elsevier Patient Education  2020 [employer].

## 2020-03-31 NOTE — Progress Notes (Signed)
[name], MD [address]  [address]  [place], [place] [address]  Main: [phone]  Fax: [phone]    Gastroenterology Consultation  Referring Provider:     Lynnea Ferrier, MD Primary Care Physician:  Lynnea Ferrier, MD Primary Gastroenterologist:  Dr. Mechele Collin Reason for Consultation: Crohn's disease        HPI:   Chad Cardenas is a 64 y.o. male referred by Dr. Graciela Husbands, Susanne Borders III, MD  for consultation & management of Crohn's disease  Patient has history of colonic Crohn's diagnosed in 77s s/p partial colectomy, currently maintained on Humira biweekly, in clinical remission.  Patient switched his care from [facility] clinic gastroenterology to [facility].  Patient reports his normal state of health, tolerating Humira well, reports having chronic diarrhea since colectomy.  He has not tried any Imodium.  Follow-up visit 12/09/2019 Patient reports he has been doing significantly better with regards to diarrhea.  He is on Questran 4 g twice daily with 50% improvement in frequency, decreased from 8 to 10/day to 4 during the day and 1 at night.  He has been gaining weight.  He is also concerned about acid reflux.  He is currently taking Protonix 40 mg 2 times daily, increased from once a day.  He reports constant clearing of the throat as well as heartburn.  He reports that he had esophageal dilation in the past, is asking for EGD  Follow-up visit 03/31/2020 Patient is here for follow-up of his Crohn's disease.  He reports doing very well from Crohn's standpoint.  His diarrhea has resolved with occasional episodes of nocturnal diarrhea only.  His stools are more formed, 1-2 times daily.  Patient underwent upper endoscopy as well as colonoscopy in 9/21 which revealed moderately active colitis, also his fecal calprotectin levels were elevated, with Humira trough levels were subtherapeutic.  Therefore, he underwent Humira reinduction followed by Humira weekly dosing.   Patient is tolerating Humira well.  Patient he has been gaining weight due to high calorie intake.  His most recent LFTs on 03/21/2020 revealed mildly elevated transaminases, AST/ALT 48/59.  Crohn's disease classification:  Age: 82 to 25 Location:  colonic Behavior:  stricturing  Perianal: no  IBD diagnosis:1980s   Disease course: He had partial colectomy of the ascending/transverse colon at [facility], then left colectomy 2014 along with ventral hernia repair & lysis of adhesions.  Patient has been maintained on Humira for more than 10 years, biweekly, reports doing well.  He has tolerated Humira well.  He had shingles about 6 months ago.  Currently, experiencing 5-6 nonbloody bowel movements per day and 1 at night since he had partial colectomy. No evidence of anemia, normal inflammatory markers, fecal calprotectin level was 48 in 08/2018.  Celiac serologies negative.  Immune to hepatitis A and B.  EGD and colonoscopy 9/21 revealed moderately active colitis, elevated fecal calprotectin levels 183, stool cultures negative for infectious etiology.  Humira trough level 3.8, undetectable antibodies.  Humira reinduction followed by weekly dosing since 9/21.  Extra intestinal manifestations: None  IBD surgical history: Partial colectomy of the ascending and transverse colon in 1992, left colectomy in 2014 with ventral hernia repair and lysis of adhesions  Imaging:  MRE none CTE none SBFT none  Procedures:  Endoscopies done 01/07/19. EGD: reflux esophagitis. Small hiatal hernia. Mild gastritis. No barretts, dysplasia, malignancy. Colonoscopy: ulcerated ileocolonic anastomosis. Ileum otherwise appeared normal as did the rest of the colon. Biopsies of the colon  were negative with the exception ofacute inflammation but no dysplasia malignancy at the ileocolonic anastomosis. 5y repeat recommended.  EGD and colonoscopy 12/30/2019 - Patent functional end-to-end ileo-colonic anastomosis, characterized  by inflammation. - Aphtha in the entire examined colon. Biopsied. - Patchy moderate inflammation was found in the rectum secondary to proctitis. - The examined portion of the ileum was normal.  - Normal examined duodenum. - Normal stomach. - Esophagogastric landmarks identified. - Salmon-colored mucosa suspicious for Barrett's esophagus. Biopsied. - Normal esophagus.  DIAGNOSIS:  A. GASTROESOPHAGEAL JUNCTION,; MUCOSA; COLD BIOPSY:  - SQUAMOCOLUMNAR MUCOSA WITH FEATURES OF REFLUX GASTROESOPHAGITIS.  - NEGATIVE FOR INTESTINAL METAPLASIA, DYSPLASIA, AND MALIGNANCY.   B. COLON, LEFT; COLD BIOPSY:  - CHRONIC COLITIS WITH MODERATE ACTIVITY (CRYPTITIS, CRYPT ABSCESSES,  WITH POSSIBLE SUPERFICIAL ULCERATION).  - NEGATIVE FOR GRANULOMA, DYSPLASIA, AND MALIGNANCY.   VCE none  IBD medications:  Steroids: None 5-ASA: no mesalamine Immunomodulators: AZA, methotrexate none TPMT status unknown Biologics: Anti TNFs: Humira biweekly for more than 15 years Anti Integrins: Ustekinumab: Tofactinib: Clinical trial:   Past Medical History:  Diagnosis Date  . Arthritis   . Crohn's disease (HCC)   . Fatty liver disease, nonalcoholic   . GERD (gastroesophageal reflux disease)   . History of kidney stones    in the past  . Hypertension   . Sleep apnea    Use C-PAP    Past Surgical History:  Procedure Laterality Date  . COLON SURGERY  2014   Partial Colectomy  . COLONOSCOPY WITH PROPOFOL N/A 12/30/2019   Procedure: COLONOSCOPY WITH BIOPSY;  Surgeon: Toney Reil, MD;  Location: [facility];  Service: Endoscopy;  Laterality: N/A;  Previous colectomy, extent reached at [phone]   . ESOPHAGOGASTRODUODENOSCOPY (EGD) WITH PROPOFOL N/A 12/30/2019   Procedure: ESOPHAGOGASTRODUODENOSCOPY (EGD) WITH BIOPSY;  Surgeon: Toney Reil, MD;  Location: [facility];  Service: Endoscopy;  Laterality: N/A;  requests early appt  . HERNIA REPAIR  2014   Ventral Hernia Repair  .  VENTRAL HERNIA REPAIR N/A 07/04/2016   Procedure: HERNIA REPAIR VENTRAL ADULT;  Surgeon: Nadeen Landau, MD;  Location: [facility];  Service: General;  Laterality: N/A;    Current Outpatient Medications:  .  Adalimumab (HUMIRA PEN) 40 MG/0.4ML PNKT, Inject 40 mg into the skin every 7 (seven) days., Disp: 12 each, Rfl: 1 .  Cholecalciferol (VITAMIN D3) 2000 UNITS capsule, Take 2,000 Units by mouth. , Disp: , Rfl:  .  cholestyramine (QUESTRAN) 4 g packet, MIX 1 PACKET (4 G TOTAL) IN LIQUID OF CHOICE BY MOUTH 2 TIMES DAILY., Disp: 180 packet, Rfl: 1 .  fenofibrate 160 MG tablet, Take by mouth., Disp: , Rfl:  .  hydrochlorothiazide (HYDRODIURIL) 25 MG tablet, Take by mouth., Disp: , Rfl:  .  losartan (COZAAR) 50 MG tablet, Take by mouth., Disp: , Rfl:  .  pantoprazole (PROTONIX) 40 MG tablet, Take 40 mg by mouth daily., Disp: , Rfl:  .  pneumococcal 13-valent conjugate vaccine (PREVNAR 13) SUSP injection, Inject 0.5 mLs into the muscle tomorrow at 10 am for 1 dose., Disp: 1 mL, Rfl: 0   Family History  Problem Relation Age of Onset  . Heart attack Mother   . Hypertension Father      Social History   Tobacco Use  . Smoking status: Never Smoker  . Smokeless tobacco: Never Used  Substance Use Topics  . Alcohol use: No    Alcohol/week: 0.0 standard drinks  . Drug use: No    Allergies  as of 03/31/2020  . (No Known Allergies)    Review of Systems:    All systems reviewed and negative except where noted in HPI.   Physical Exam:  BP (!) 150/94 (BP Location: Left Arm, Patient Position: Sitting, Cuff Size: Normal)   Pulse 76   Temp 97.6 F (36.4 C) (Oral)   Ht 5\' 7"  (1.702 m)   Wt 218 lb 8 oz (99.1 kg)   BMI 34.22 kg/m  No LMP for male patient.  General:   Alert,  Well-developed, well-nourished, pleasant and cooperative in NAD Head:  Normocephalic and atraumatic. Eyes:  Sclera clear, no icterus.   Conjunctiva pink. Ears:  Normal auditory acuity. Nose:  No deformity,  discharge, or lesions. Mouth:  No deformity or lesions,oropharynx pink & moist. Neck:  Supple; no masses or thyromegaly. Lungs:  Respirations even and unlabored.  Clear throughout to auscultation.   No wheezes, crackles, or rhonchi. No acute distress. Heart:  Regular rate and rhythm; no murmurs, clicks, rubs, or gallops. Abdomen:  Normal bowel sounds. Soft, vertical scar in midline, well-healed, non-tender and non-distended without masses, hepatosplenomegaly or hernias noted.  No guarding or rebound tenderness.   Rectal: Not performed Msk:  Symmetrical without gross deformities. Good, equal movement & strength bilaterally. Pulses:  Normal pulses noted. Extremities:  No clubbing or edema.  No cyanosis. Neurologic:  Alert and oriented x3;  grossly normal neurologically. Skin:  Intact without significant lesions or rashes. No jaundice. Psych:  Alert and cooperative. Normal mood and affect.  Imaging Studies: Reviewed  Assessment and Plan:   Chad Cardenas is a 64 y.o. male with history of hypertension, chronic GERD, Crohn's colitis diagnosed in 73s s/p partial colectomy in 1992 and 2014, with moderately active colitis based on colonoscopy 9/21, subtherapeutic Humira levels, elevated fecal calprotectin levels, s/p Humira reinduction and increased maintenance to weekly  Crohn's colitis, s/p ileocolonic anastomosis: In clinical remission Continue Humira weekly, monotherapy Continue cholestyramine 2 times daily for chronic diarrhea which is likely secondary to subtotal colectomy Recommend colonoscopy to assess response to weekly Humira in 6 months Adalimumab drug levels are subtherapeutic at 4.8, undetectable antibodies as of 12/21/2019 Recheck Humira trough level before next dose Recheck fecal calprotectin levels  Chronic GERD EGD revealed reflux esophagitis Secondary to weight gain and eating habits Discussed about antireflux lifestyle, information provided Continue Protonix 40 mg twice  daily before meals  IBD Health Maintenance  1.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 Anemia: Not present 3.Immunizations: Hep A and B immune, recommend annual influenza vaccine, recommend prevnar followed by pneumovax.  Patient had shingles 1 year ago.  Recommend Shingrix vaccine 4.Cancer screening I) Colon cancer/dysplasia surveillance: Last colonoscopy 12/2019, repeat colonoscopy in 07/2019 II) Skin cancer - counseled about annual skin exam by dermatology and skin protection in summer using sun screen SPF > 50, clothing 5.Bone health Vitamin D status: normal  Bone density testing: n/a 5. Labs: Every 3 months 6. Smoking: n/a 7. NSAIDs and Antibiotics use: n/a  Elevated LFTs, most likely secondary to fatty liver with recent weight gain Ultrasound liver with elastography in 6/20 revealed F0-F1 Discussed about low-fat diet, regular exercise Recheck LFTs in 1 month by his PCP   Follow up in 2-3 months   7/20, MD

## 2020-04-03 ENCOUNTER — Other Ambulatory Visit: Payer: Self-pay | Admitting: Gastroenterology

## 2020-04-05 LAB — CALPROTECTIN, FECAL: Calprotectin, Fecal: 146 ug/g — ABNORMAL HIGH (ref 0–120)

## 2020-04-06 ENCOUNTER — Telehealth: Payer: Self-pay

## 2020-04-06 DIAGNOSIS — K50111 Crohn's disease of large intestine with rectal bleeding: Secondary | ICD-10-CM

## 2020-04-06 NOTE — Telephone Encounter (Signed)
-----   Message from Toney Reil, MD sent at 04/06/2020  3:13 PM EST ----- Recommend GI profile PCR to rule out infection  RV

## 2020-04-06 NOTE — Telephone Encounter (Signed)
Tried to call patient and someone answer then disconnected the phone. Will try again later. Order stool study

## 2020-04-07 LAB — SERIAL MONITORING

## 2020-04-09 LAB — ADALIMUMAB+AB (SERIAL MONITOR)
Adalimumab Drug Level: 7.6 ug/mL
Anti-Adalimumab Antibody: <25 ng/mL

## 2020-04-11 NOTE — Telephone Encounter (Signed)
Patient read my chart message

## 2020-04-12 LAB — SERIAL MONITORING

## 2020-04-13 LAB — ADALIMUMAB+AB (SERIAL MONITOR)
Adalimumab Drug Level: 7.8 ug/mL
Anti-Adalimumab Antibody: <25 ng/mL

## 2020-05-17 ENCOUNTER — Encounter: Payer: Self-pay | Admitting: Gastroenterology

## 2020-05-17 ENCOUNTER — Telehealth: Payer: Self-pay

## 2020-05-17 NOTE — Telephone Encounter (Signed)
Faxed appeal letter to [facility]

## 2020-05-17 NOTE — Telephone Encounter (Signed)
Wrote a letter for urgent appeal  RV

## 2020-05-17 NOTE — Telephone Encounter (Signed)
[facility] [employer] fax [place] papers to fill out for a PA for Humira Pen 40mg /0.4 every 7 days. Did prior authorization and they denied the request.  PA number [employer] health plan [phone] AS You can write a letter stating why the patient needs the medication and faxed request to [phone] this is the urgent appeal department

## 2020-05-22 NOTE — Telephone Encounter (Signed)
[employer] approved Humira 40mg  weekly

## 2020-06-01 ENCOUNTER — Encounter: Payer: Self-pay | Admitting: Gastroenterology

## 2020-06-01 ENCOUNTER — Ambulatory Visit (INDEPENDENT_AMBULATORY_CARE_PROVIDER_SITE_OTHER): Payer: BC Managed Care – PPO | Admitting: Gastroenterology

## 2020-06-01 ENCOUNTER — Other Ambulatory Visit: Payer: Self-pay

## 2020-06-01 VITALS — BP 130/83 | HR 74 | Temp 97.4°F | Ht 67.0 in | Wt 217.5 lb

## 2020-06-01 DIAGNOSIS — K760 Fatty (change of) liver, not elsewhere classified: Secondary | ICD-10-CM | POA: Diagnosis not present

## 2020-06-01 DIAGNOSIS — Z9049 Acquired absence of other specified parts of digestive tract: Secondary | ICD-10-CM

## 2020-06-01 DIAGNOSIS — K50111 Crohn's disease of large intestine with rectal bleeding: Secondary | ICD-10-CM | POA: Diagnosis not present

## 2020-06-01 MED ORDER — PNEUMOVAX 23 25 MCG/0.5ML IJ INJ: 0.5000 mL | INJECTION | INTRAMUSCULAR | 0 refills | Status: AC

## 2020-06-01 NOTE — Progress Notes (Signed)
[name], MD [address]  [address]  [place], [place] [address]  Main: [phone]  Fax: [phone]    Gastroenterology Consultation  Referring Provider:     Lynnea Ferrier, MD Primary Care Physician:  Lynnea Ferrier, MD Primary Gastroenterologist:  Dr. Mechele Collin Reason for Consultation: Crohn's disease        HPI:   Chad Cardenas is a 65 y.o. male referred by Dr. Graciela Husbands, Susanne Borders III, MD  for consultation & management of Crohn's disease  Patient has history of colonic Crohn's diagnosed in 33s s/p partial colectomy, currently maintained on Humira biweekly, in clinical remission.  Patient switched his care from [facility] clinic gastroenterology to [facility].  Patient reports his normal state of health, tolerating Humira well, reports having chronic diarrhea since colectomy.  He has not tried any Imodium.  Follow-up visit 12/09/2019 Patient reports he has been doing significantly better with regards to diarrhea.  He is on Questran 4 g twice daily with 50% improvement in frequency, decreased from 8 to 10/day to 4 during the day and 1 at night.  He has been gaining weight.  He is also concerned about acid reflux.  He is currently taking Protonix 40 mg 2 times daily, increased from once a day.  He reports constant clearing of the throat as well as heartburn.  He reports that he had esophageal dilation in the past, is asking for EGD  Follow-up visit 03/31/2020 Patient is here for follow-up of his Crohn's disease.  He reports doing very well from Crohn's standpoint.  His diarrhea has resolved with occasional episodes of nocturnal diarrhea only.  His stools are more formed, 1-2 times daily.  Patient underwent upper endoscopy as well as colonoscopy in 9/21 which revealed moderately active colitis, also his fecal calprotectin levels were elevated, with Humira trough levels were subtherapeutic.  Therefore, he underwent Humira reinduction followed by Humira weekly dosing.   Patient is tolerating Humira well.  Patient he has been gaining weight due to high calorie intake.  His most recent LFTs on 03/21/2020 revealed mildly elevated transaminases, AST/ALT 48/59.  Follow-up visit 06/01/2020 Patient reports that he has been doing well from Crohn's standpoint.  He reports having 3 bowel movements daily, he thinks he cannot get any better than this.  He takes cholestyramine 1 packet daily.  He is on Humira weekly.  Patient reports that he followed strict diet, lost about 10 pounds.  He went to [place] for 2 weeks, just returned from vacation.  He said, he regained that weight back.  His most recent LFTs still mildly elevated.  Crohn's disease classification:  Age: 81 to 28 Location:  colonic Behavior:  stricturing  Perianal: no  IBD diagnosis:1980s   Disease course: He had partial colectomy of the ascending/transverse colon at [facility], then left colectomy 2014 along with ventral hernia repair & lysis of adhesions.  Patient has been maintained on Humira for more than 10 years, biweekly, reports doing well.  He has tolerated Humira well.  He had shingles about 6 months ago.  Currently, experiencing 5-6 nonbloody bowel movements per day and 1 at night since he had partial colectomy. No evidence of anemia, normal inflammatory markers, fecal calprotectin level was 48 in 08/2018.  Celiac serologies negative.  Immune to hepatitis A and B.  EGD and colonoscopy 9/21 revealed moderately active colitis, elevated fecal calprotectin levels 183, stool cultures negative for infectious etiology.  Humira trough level 3.8, undetectable antibodies.  Humira reinduction followed by weekly dosing since 9/21.  Extra intestinal manifestations: None  IBD surgical history: Partial colectomy of the ascending and transverse colon in 1992, left colectomy in 2014 with ventral hernia repair and lysis of adhesions  Imaging:  MRE none CTE none SBFT none  Procedures:  Endoscopies done  01/07/19. EGD: reflux esophagitis. Small hiatal hernia. Mild gastritis. No barretts, dysplasia, malignancy. Colonoscopy: ulcerated ileocolonic anastomosis. Ileum otherwise appeared normal as did the rest of the colon. Biopsies of the colon were negative with the exception ofacute inflammation but no dysplasia malignancy at the ileocolonic anastomosis. 5y repeat recommended.  EGD and colonoscopy 12/30/2019 - Patent functional end-to-end ileo-colonic anastomosis, characterized by inflammation. - Aphtha in the entire examined colon. Biopsied. - Patchy moderate inflammation was found in the rectum secondary to proctitis. - The examined portion of the ileum was normal.  - Normal examined duodenum. - Normal stomach. - Esophagogastric landmarks identified. - Salmon-colored mucosa suspicious for Barrett's esophagus. Biopsied. - Normal esophagus.  DIAGNOSIS:  A. GASTROESOPHAGEAL JUNCTION,; MUCOSA; COLD BIOPSY:  - SQUAMOCOLUMNAR MUCOSA WITH FEATURES OF REFLUX GASTROESOPHAGITIS.  - NEGATIVE FOR INTESTINAL METAPLASIA, DYSPLASIA, AND MALIGNANCY.   B. COLON, LEFT; COLD BIOPSY:  - CHRONIC COLITIS WITH MODERATE ACTIVITY (CRYPTITIS, CRYPT ABSCESSES,  WITH POSSIBLE SUPERFICIAL ULCERATION).  - NEGATIVE FOR GRANULOMA, DYSPLASIA, AND MALIGNANCY.   VCE none  IBD medications:  Steroids: None 5-ASA: no mesalamine Immunomodulators: AZA, methotrexate none TPMT status unknown Biologics: Anti TNFs: Humira biweekly for more than 15 years Anti Integrins: Ustekinumab: Tofactinib: Clinical trial:   Past Medical History:  Diagnosis Date  . Arthritis   . Crohn's disease (HCC)   . Fatty liver disease, nonalcoholic   . GERD (gastroesophageal reflux disease)   . History of kidney stones    in the past  . Hypertension   . Sleep apnea    Use C-PAP    Past Surgical History:  Procedure Laterality Date  . COLON SURGERY  2014   Partial Colectomy  . COLONOSCOPY WITH PROPOFOL N/A 12/30/2019   Procedure:  COLONOSCOPY WITH BIOPSY;  Surgeon: Toney ReilVanga, Fran Mcree Reddy, MD;  [facility];  Service: Endoscopy;  Laterality: N/A;  Previous colectomy, extent reached at [phone]   . ESOPHAGOGASTRODUODENOSCOPY (EGD) WITH PROPOFOL N/A 12/30/2019   Procedure: ESOPHAGOGASTRODUODENOSCOPY (EGD) WITH BIOPSY;  Surgeon: Toney ReilVanga, Hollyann Pablo Reddy, MD;  [facility];  Service: Endoscopy;  Laterality: N/A;  requests early appt  . HERNIA REPAIR  2014   Ventral Hernia Repair  . VENTRAL HERNIA REPAIR N/A 07/04/2016   Procedure: HERNIA REPAIR VENTRAL ADULT;  Surgeon: Nadeen LandauJarvis Wilton Smith, MD;  Location: [facility];  Service: General;  Laterality: N/A;    Current Outpatient Medications:  .  Adalimumab (HUMIRA PEN) 40 MG/0.4ML PNKT, Inject 40 mg into the skin every 7 (seven) days., Disp: 12 each, Rfl: 1 .  Cholecalciferol (VITAMIN D3) 2000 UNITS capsule, Take 2,000 Units by mouth. , Disp: , Rfl:  .  cholestyramine (QUESTRAN) 4 g packet, MIX 1 PACKET (4 G TOTAL) IN LIQUID OF CHOICE BY MOUTH 2 TIMES DAILY., Disp: 180 packet, Rfl: 1 .  fenofibrate 160 MG tablet, Take by mouth., Disp: , Rfl:  .  metFORMIN (GLUCOPHAGE-XR) 500 MG 24 hr tablet, Take 1,000 mg by mouth every morning., Disp: , Rfl:  .  pantoprazole (PROTONIX) 40 MG tablet, Take 40 mg by mouth daily., Disp: , Rfl:  .  pneumococcal 23 valent vaccine (PNEUMOVAX 23) 25 MCG/0.5ML injection, Inject 0.5 mLs into the muscle tomorrow at  10 am for 1 dose., Disp: 2.5 mL, Rfl: 0 .  ramipril (ALTACE) 10 MG capsule, Take 10 mg by mouth daily., Disp: , Rfl:  .  rosuvastatin (CRESTOR) 5 MG tablet, Take 5 mg by mouth daily., Disp: , Rfl:  .  TRULICITY 1.5 MG/0.5ML SOPN, SMARTSIG:0.5 Milliliter(s) SUB-Q Once a Week, Disp: , Rfl:  .  hydrochlorothiazide (HYDRODIURIL) 25 MG tablet, Take by mouth., Disp: , Rfl:  .  losartan (COZAAR) 50 MG tablet, Take by mouth., Disp: , Rfl:    Family History  Problem Relation Age of Onset  . Heart attack Mother   . Hypertension  Father      Social History   Tobacco Use  . Smoking status: Never Smoker  . Smokeless tobacco: Never Used  Substance Use Topics  . Alcohol use: No    Alcohol/week: 0.0 standard drinks  . Drug use: No    Allergies as of 06/01/2020  . (No Known Allergies)    Review of Systems:    All systems reviewed and negative except where noted in HPI.   Physical Exam:  BP 130/83 (BP Location: Left Arm, Patient Position: Sitting, Cuff Size: Normal)   Pulse 74   Temp (!) 97.4 F (36.3 C) (Oral)   Ht 5\' 7"  (1.702 m)   Wt 217 lb 8 oz (98.7 kg)   BMI 34.07 kg/m  No LMP for male patient.  General:   Alert,  Well-developed, well-nourished, pleasant and cooperative in NAD Head:  Normocephalic and atraumatic. Eyes:  Sclera clear, no icterus.   Conjunctiva pink. Ears:  Normal auditory acuity. Nose:  No deformity, discharge, or lesions. Mouth:  No deformity or lesions,oropharynx pink & moist. Neck:  Supple; no masses or thyromegaly. Lungs:  Respirations even and unlabored.  Clear throughout to auscultation.   No wheezes, crackles, or rhonchi. No acute distress. Heart:  Regular rate and rhythm; no murmurs, clicks, rubs, or gallops. Abdomen:  Normal bowel sounds. Soft, vertical scar in midline, well-healed, non-tender and non-distended without masses, hepatosplenomegaly or hernias noted.  No guarding or rebound tenderness.   Rectal: Not performed Msk:  Symmetrical without gross deformities. Good, equal movement & strength bilaterally. Pulses:  Normal pulses noted. Extremities:  No clubbing or edema.  No cyanosis. Neurologic:  Alert and oriented x3;  grossly normal neurologically. Skin:  Intact without significant lesions or rashes. No jaundice. Psych:  Alert and cooperative. Normal mood and affect.  Imaging Studies: Reviewed  Assessment and Plan:   ERVINE WITUCKI is a 65 y.o. male with history of hypertension, chronic GERD, Crohn's colitis diagnosed in 37s s/p partial colectomy in  1992 and 2014, with moderately active colitis based on colonoscopy 9/21, subtherapeutic Humira levels, elevated fecal calprotectin levels, s/p Humira reinduction and increased maintenance to weekly  Crohn's colitis, s/p ileocolonic anastomosis: In clinical remission Continue Humira weekly, monotherapy Fecal calprotectin levels are still elevated, slightly improved from 183, 5 months ago to 146, 1 month ago Continue cholestyramine 1-2 times daily for chronic diarrhea which is likely secondary to subtotal colectomy Recommend colonoscopy to assess response to weekly Humira in May 2022 Adalimumab drug levels are currently therapeutic at 7.8, undetectable antibodies as of 04/03/2020  Chronic GERD EGD revealed reflux esophagitis Secondary to weight gain and eating habits Discussed about antireflux lifestyle, information provided Continue Protonix 40 mg twice daily before meals  IBD Health Maintenance  1.TB status: QuantiFERON gold negative in 08/2018, recheck today 2. Anemia: Not present 3.Immunizations: Hep A and B immune, recommend annual  influenza vaccine, [facility] given him the prescription for pneumovax.  Patient had shingles 1 year ago.  He received first dose of Shingrix vaccine, due for second dose on 3/17 4.Cancer screening I) Colon cancer/dysplasia surveillance: Last colonoscopy 12/2019, repeat colonoscopy in 5/22 II) Skin cancer - counseled about annual skin exam by dermatology and skin protection in summer using sun screen SPF > 50, clothing 5.Bone health Vitamin D status: normal  Bone density testing: n/a 5. Labs: Every 3 months 6. Smoking: n/a 7. NSAIDs and Antibiotics use: n/a  Elevated LFTs, most likely secondary to fatty liver with recent weight gain Ultrasound liver with elastography in 6/20 revealed F0-F1 Reiterated on low-fat diet, regular exercise Recheck LFTs in 3 months, if persistently elevated, will pursue secondary liver disease  work-up   Follow up in 4 months after the colonoscopy   [name], MD

## 2020-06-03 LAB — QUANTIFERON-TB GOLD PLUS
QuantiFERON Mitogen Value: >10 IU/mL
QuantiFERON Nil Value: 0.04 IU/mL
QuantiFERON TB1 Ag Value: 0.04 IU/mL
QuantiFERON TB2 Ag Value: 0.03 IU/mL
QuantiFERON-TB Gold Plus: NEGATIVE

## 2020-06-06 ENCOUNTER — Encounter: Payer: Self-pay | Admitting: Gastroenterology

## 2020-06-07 ENCOUNTER — Encounter: Payer: Self-pay | Admitting: Gastroenterology

## 2020-06-07 ENCOUNTER — Other Ambulatory Visit: Payer: Self-pay

## 2020-06-07 ENCOUNTER — Other Ambulatory Visit: Payer: Self-pay | Admitting: Gastroenterology

## 2020-06-07 DIAGNOSIS — K50111 Crohn's disease of large intestine with rectal bleeding: Secondary | ICD-10-CM

## 2020-06-07 MED ORDER — NA SULFATE-K SULFATE-MG SULF 17.5-3.13-1.6 GM/177ML PO SOLN: 354.0000 mL | Freq: Once | ORAL | 0 refills | Status: DC

## 2020-06-07 NOTE — Telephone Encounter (Signed)
Got patient scheduled for 08/21/2020. Sent prep to the pharmacy and sent instructions in the mail and to [facility]

## 2020-06-16 ENCOUNTER — Encounter: Payer: Self-pay | Admitting: Gastroenterology

## 2020-06-16 NOTE — Telephone Encounter (Signed)
How do we add the [employer]BS to his office visit

## 2020-06-21 ENCOUNTER — Encounter: Payer: Self-pay | Admitting: Gastroenterology

## 2020-06-22 NOTE — Telephone Encounter (Signed)
Informed patient that she needed to call the billing department and let them know that [employer]BS needed to be added to his office visit. Gave him the number which is [phone]. He states he will call them

## 2020-07-20 ENCOUNTER — Other Ambulatory Visit: Payer: Self-pay | Admitting: Gastroenterology

## 2020-08-13 ENCOUNTER — Other Ambulatory Visit: Payer: Self-pay | Admitting: Gastroenterology

## 2020-08-13 DIAGNOSIS — K50112 Crohn's disease of large intestine with intestinal obstruction: Secondary | ICD-10-CM

## 2020-08-13 DIAGNOSIS — Z9049 Acquired absence of other specified parts of digestive tract: Secondary | ICD-10-CM

## 2020-08-18 ENCOUNTER — Encounter: Payer: Self-pay | Admitting: Gastroenterology

## 2020-08-19 NOTE — Anesthesia Preprocedure Evaluation (Addendum)
Anesthesia Evaluation  Patient identified by MRN, date of birth, ID band Patient awake    Reviewed: Allergy & Precautions, NPO status , Patient's Chart, lab work & pertinent test results  Airway Mallampati: III  TM Distance: >3 FB Neck ROM: Full    Dental no notable dental hx.    Pulmonary sleep apnea ,    Pulmonary exam normal        Cardiovascular hypertension, Pt. on medications Normal cardiovascular exam     Neuro/Psych negative neurological ROS  negative psych ROS   GI/Hepatic GERD  Controlled and Medicated,  Endo/Other    Renal/GU      Musculoskeletal   Abdominal Normal abdominal exam  (+)   Peds  Hematology   Anesthesia Other Findings   Reproductive/Obstetrics                                                            Anesthesia Evaluation  Patient identified by MRN, date of birth, ID band Patient awake    Reviewed: Allergy & Precautions, NPO status , Patient's Chart, lab work & pertinent test results, reviewed documented beta blocker date and time   History of Anesthesia Complications Negative for: history of anesthetic complications  Airway Mallampati: III  TM Distance: >3 FB Neck ROM: Full    Dental   Pulmonary sleep apnea ,    breath sounds clear to auscultation       Cardiovascular hypertension, (-) angina(-) DOE  Rhythm:Regular Rate:Normal   HLD   Neuro/Psych    GI/Hepatic GERD  Controlled, NASH  Chron's   Endo/Other    Renal/GU Renal disease (Stones)     Musculoskeletal  (+) Arthritis ,   Abdominal   Peds  Hematology   Anesthesia Other Findings   Reproductive/Obstetrics                            Anesthesia Physical Anesthesia Plan  ASA: II  Anesthesia Plan: General   Post-op Pain Management:    Induction: Intravenous  PONV Risk Score and Plan: 2 and Propofol infusion, TIVA and Treatment may vary  due to age or medical condition  Airway Management Planned: Natural Airway and Nasal Cannula  Additional Equipment:   Intra-op Plan:   Post-operative Plan:   Informed Consent: I have reviewed the patients History and Physical, chart, labs and discussed the procedure including the risks, benefits and alternatives for the proposed anesthesia with the patient or authorized representative who has indicated his/her understanding and acceptance.       Plan Discussed with: CRNA and Anesthesiologist  Anesthesia Plan Comments:        Anesthesia Quick Evaluation  Anesthesia Physical Anesthesia Plan  ASA: II  Anesthesia Plan: General   Post-op Pain Management:    Induction:   PONV Risk Score and Plan: 1 and Propofol infusion and TIVA  Airway Management Planned: Nasal Cannula  Additional Equipment:   Intra-op Plan:   Post-operative Plan:   Informed Consent: I have reviewed the patients History and Physical, chart, labs and discussed the procedure including the risks, benefits and alternatives for the proposed anesthesia with the patient or authorized representative who has indicated his/her understanding and acceptance.       Plan Discussed with: CRNA and Anesthesiologist  Anesthesia Plan Comments:         Anesthesia Quick Evaluation

## 2020-08-21 ENCOUNTER — Ambulatory Visit
Admission: RE | Admit: 2020-08-21 | Discharge: 2020-08-21 | Disposition: A | Discharge status: Home / Self Care | Payer: Medicare PPO | Attending: Gastroenterology | Admitting: Gastroenterology

## 2020-08-21 ENCOUNTER — Encounter
Admission: RE | Disposition: A | Discharge status: Home / Self Care | Payer: Self-pay | Source: Home / Self Care | Attending: Gastroenterology

## 2020-08-21 ENCOUNTER — Ambulatory Visit: Payer: Medicare PPO | Admitting: Anesthesiology

## 2020-08-21 DIAGNOSIS — Z79899 Other long term (current) drug therapy: Secondary | ICD-10-CM | POA: Insufficient documentation

## 2020-08-21 DIAGNOSIS — K633 Ulcer of intestine: Secondary | ICD-10-CM | POA: Insufficient documentation

## 2020-08-21 DIAGNOSIS — K5010 Crohn's disease of large intestine without complications: Secondary | ICD-10-CM | POA: Insufficient documentation

## 2020-08-21 DIAGNOSIS — D72820 Lymphocytosis (symptomatic): Secondary | ICD-10-CM | POA: Insufficient documentation

## 2020-08-21 DIAGNOSIS — K760 Fatty (change of) liver, not elsewhere classified: Secondary | ICD-10-CM

## 2020-08-21 DIAGNOSIS — K50111 Crohn's disease of large intestine with rectal bleeding: Secondary | ICD-10-CM | POA: Diagnosis not present

## 2020-08-21 DIAGNOSIS — Z980 Intestinal bypass and anastomosis status: Secondary | ICD-10-CM | POA: Diagnosis not present

## 2020-08-21 DIAGNOSIS — Z9049 Acquired absence of other specified parts of digestive tract: Secondary | ICD-10-CM | POA: Insufficient documentation

## 2020-08-21 DIAGNOSIS — K649 Unspecified hemorrhoids: Secondary | ICD-10-CM | POA: Insufficient documentation

## 2020-08-21 HISTORY — PX: COLONOSCOPY WITH PROPOFOL: SHX5780

## 2020-08-21 SURGERY — COLONOSCOPY WITH PROPOFOL
Anesthesia: General

## 2020-08-21 MED ORDER — LIDOCAINE HCL (PF) 2 % IJ SOLN
INTRAMUSCULAR | Status: AC
  Filled 2020-08-21: qty 4

## 2020-08-21 MED ORDER — LIDOCAINE HCL (CARDIAC) PF 100 MG/5ML IV SOSY
PREFILLED_SYRINGE | INTRAVENOUS | Status: DC | PRN
  Administered 2020-08-21: 60 mg via INTRAVENOUS

## 2020-08-21 MED ORDER — FENTANYL CITRATE (PF) 100 MCG/2ML IJ SOLN
INTRAMUSCULAR | Status: AC
  Filled 2020-08-21: qty 2

## 2020-08-21 MED ORDER — PROPOFOL 500 MG/50ML IV EMUL
INTRAVENOUS | Status: AC
  Filled 2020-08-21: qty 50

## 2020-08-21 MED ORDER — PROPOFOL 500 MG/50ML IV EMUL
INTRAVENOUS | Status: DC | PRN
  Administered 2020-08-21: 50 ug/kg/min via INTRAVENOUS

## 2020-08-21 MED ORDER — MIDAZOLAM HCL 2 MG/2ML IJ SOLN
INTRAMUSCULAR | Status: AC
  Filled 2020-08-21: qty 2

## 2020-08-21 MED ORDER — PROPOFOL 10 MG/ML IV BOLUS
INTRAVENOUS | Status: DC | PRN
  Administered 2020-08-21 (×2): 20 mg via INTRAVENOUS

## 2020-08-21 MED ORDER — PHENYLEPHRINE HCL (PRESSORS) 10 MG/ML IV SOLN
INTRAVENOUS | Status: AC
  Filled 2020-08-21: qty 1

## 2020-08-21 MED ORDER — MIDAZOLAM HCL 2 MG/2ML IJ SOLN
INTRAMUSCULAR | Status: DC | PRN
  Administered 2020-08-21: 2 mg via INTRAVENOUS

## 2020-08-21 MED ORDER — SODIUM CHLORIDE 0.9 % IV SOLN
INTRAVENOUS | Status: DC
  Administered 2020-08-21: 20 mL/h via INTRAVENOUS

## 2020-08-21 MED ORDER — FENTANYL CITRATE (PF) 100 MCG/2ML IJ SOLN
INTRAMUSCULAR | Status: DC | PRN
  Administered 2020-08-21: 50 ug via INTRAVENOUS
  Administered 2020-08-21: 25 ug via INTRAVENOUS

## 2020-08-21 NOTE — Transfer of Care (Signed)
Immediate Anesthesia Transfer of Care Note  Patient: JAMARRI VUNCANNON  Procedure(s) Performed: COLONOSCOPY WITH PROPOFOL (N/A )  Patient Location: PACU  Anesthesia Type:General  Level of Consciousness: sedated  Airway & Oxygen Therapy: Patient Spontanous Breathing and Patient connected to nasal cannula oxygen  Post-op Assessment: Report given to RN and Post -op Vital signs reviewed and stable  Post vital signs: Reviewed and stable  Last Vitals:  Vitals Value Taken Time  BP    Temp    Pulse 57 08/21/20 0832  Resp 12 08/21/20 0832  SpO2 98 % 08/21/20 0832  Vitals shown include unvalidated device data.  Last Pain:  Vitals:   08/21/20 0712  TempSrc: Temporal  PainSc: 0-No pain         Complications: No complications documented.

## 2020-08-21 NOTE — Op Note (Signed)
[facility]astroenterology Patient Name: Chad Cardenas Procedure Date: 08/21/2020 7:12 AM MRN: [id number] Account #: [id number] Date of Birth: 1955/04/26 Admit Type: Outpatient Age: 65 Room: [facility] ENDO ROOM 2 Gender: Male Note Status: Finalized Procedure:             Flexible Sigmoidoscopy Indications:           Disease activity assessment of Crohn's disease of the                         colon, Assess therapeutic response to therapy of                         Crohn's disease of the colon Providers:             Toney Reil MD, MD Referring MD:          Daniel Nones, MD (Referring MD) Medicines:             General Anesthesia Complications:         No immediate complications. Estimated blood loss: None. Procedure:             Pre-Anesthesia Assessment:                        - Prior to the procedure, a History and Physical was                         performed, and patient medications and allergies were                         reviewed. The patient is competent. The risks and                         benefits of the procedure and the sedation options and                         risks were discussed with the patient. All questions                         were answered and informed consent was obtained.                         Patient identification and proposed procedure were                         verified by the physician, the nurse, the                         anesthesiologist, the anesthetist and the technician                         in the pre-procedure area in the procedure room in the                         endoscopy suite. Mental Status Examination: alert and                         oriented. Airway Examination: normal oropharyngeal  airway and neck mobility. Respiratory Examination:                         clear to auscultation. CV Examination: normal.                         Prophylactic Antibiotics: The patient does not  require                         prophylactic antibiotics. Prior Anticoagulants: The                         patient has taken no previous anticoagulant or                         antiplatelet agents. ASA Grade Assessment: III - A                         patient with severe systemic disease. After reviewing                         the risks and benefits, the patient was deemed in                         satisfactory condition to undergo the procedure. The                         anesthesia plan was to use general anesthesia.                         Immediately prior to administration of medications,                         the patient was re-assessed for adequacy to receive                         sedatives. The heart rate, respiratory rate, oxygen                         saturations, blood pressure, adequacy of pulmonary                         ventilation, and response to care were monitored                         throughout the procedure. The physical status of the                         patient was re-assessed after the procedure.                        After obtaining informed consent, the scope was passed                         under direct vision. The Colonoscope was introduced                         through the anus and advanced to the the ileo-sigmoid  anastomosis. The flexible sigmoidoscopy was                         accomplished without difficulty. The patient tolerated                         the procedure well. The quality of the bowel                         preparation was good. Findings:      Hemorrhoids were found on perianal exam.      There was evidence of a prior functional end-to-end ileo-colonic       anastomosis at 35 cm proximal to the anus. This was patent and was       characterized by friable mucosa, inflammation and ulceration. The       anastomosis was traversed.      Normal neoterminal ileum, biopsies performed      Normal mucosa  was found in the sigmoid colon. Biopsies were taken with a       cold forceps for histology.      Discontinuous areas of nonbleeding ulcerated mucosa with no stigmata of       recent bleeding were present in the distal rectum secondar to crohn's. Impression:            - Hemorrhoids found on perianal exam.                        - Patent functional end-to-end ileo-colonic                         anastomosis, characterized by friable mucosa,                         inflammation and ulceration.                        - Normal mucosa in the sigmoid colon. Biopsied.                        - Mucosal ulceration. Recommendation:        - Discharge patient to home (with escort).                        - Diabetic (ADA) diet.                        - Await pathology results.                        - Return to my office as previously scheduled.                        - Repeat flexible sigmoidoscopy in 6 months to                         evaluate the response to therapy. Procedure Code(s):     --- Professional ---                        [phone], Sigmoidoscopy, flexible; with biopsy, single or  multiple Diagnosis Code(s):     --- Professional ---                        Z98.0, Intestinal bypass and anastomosis status                        K64.9, Unspecified hemorrhoids                        K63.3, Ulcer of intestine                        K50.10, Crohn's disease of large intestine without                         complications CPT copyright 2019 American Medical Association. All rights reserved. The codes documented in this report are preliminary and upon coder review may  be revised to meet current compliance requirements. Dr. Libby Mawonini Quinto Tippy Toney Reilohini Reddy Belladonna Lubinski MD, MD 08/21/2020 8:33:04 AM This report has been signed electronically. Number of Addenda: 0 Note Initiated On: 08/21/2020 7:12 AM Scope Withdrawal Time: 0 hours 6 minutes 50 seconds  Total Procedure Duration: 0 hours 7  minutes 30 seconds  Estimated Blood Loss:  Estimated blood loss: none.      [facility]

## 2020-08-21 NOTE — H&P (Signed)
[name], MD [address]  [address]  [place], [place] [address]  Main: [phone]  Fax: [phone] Pager: [phone]  Primary Care Physician:  Lynnea Ferrier, MD Primary Gastroenterologist:  Dr. [name]  Pre-Procedure History & Physical: HPI:  Chad Cardenas is a 65 y.o. male is here for an colonoscopy.   Past Medical History:  Diagnosis Date  . Arthritis   . Crohn's disease (HCC)   . Fatty liver disease, nonalcoholic   . GERD (gastroesophageal reflux disease)   . History of kidney stones    in the past  . Hypertension   . Sleep apnea    Use C-PAP    Past Surgical History:  Procedure Laterality Date  . COLON SURGERY  2014   Partial Colectomy  . COLONOSCOPY WITH PROPOFOL N/A 12/30/2019   Procedure: COLONOSCOPY WITH BIOPSY;  Surgeon: Toney Reil, MD;  Location: [facility];  Service: Endoscopy;  Laterality: N/A;  Previous colectomy, extent reached at [phone]   . ESOPHAGOGASTRODUODENOSCOPY (EGD) WITH PROPOFOL N/A 12/30/2019   Procedure: ESOPHAGOGASTRODUODENOSCOPY (EGD) WITH BIOPSY;  Surgeon: Toney Reil, MD;  Location: [facility];  Service: Endoscopy;  Laterality: N/A;  requests early appt  . HERNIA REPAIR  2014   Ventral Hernia Repair  . VENTRAL HERNIA REPAIR N/A 07/04/2016   Procedure: HERNIA REPAIR VENTRAL ADULT;  Surgeon: Nadeen Landau, MD;  Location: [facility];  Service: General;  Laterality: N/A;    Prior to Admission medications   Medication Sig Start Date End Date Taking? Authorizing Provider  cholestyramine (QUESTRAN) 4 g packet MIX 1 PACKET (4 G TOTAL) IN LIQUID OF CHOICE BY MOUTH 2 TIMES DAILY. 08/14/20   Toney Reil, MD  fenofibrate 160 MG tablet Take by mouth. 10/27/19 10/26/20  [provider]  HUMIRA PEN 40 MG/0.4ML PNKT INJECT 1 PEN UNDER THE SKIN EVERY 7 DAYS. 07/20/20   Toney Reil, MD  hydrochlorothiazide (HYDRODIURIL) 25 MG tablet Take by mouth. 01/19/19 03/31/20  [provider]  losartan (COZAAR) 50 MG tablet Take by mouth. 05/18/19 05/17/20  [provider]  pantoprazole (PROTONIX) 40 MG tablet Take 40 mg by mouth daily.    [provider]    Allergies as of 06/07/2020  . (No Known Allergies)    Family History  Problem Relation Age of Onset  . Heart attack Mother   . Hypertension Father     Social History   Socioeconomic History  . Marital status: Married    Spouse name: Not on file  . Number of children: Not on file  . Years of education: Not on file  . Highest education level: Not on file  Occupational History  . Not on file  Tobacco Use  . Smoking status: Never Smoker  . Smokeless tobacco: Never Used  Vaping Use  . Vaping Use: Never used  Substance and Sexual Activity  . Alcohol use: No    Alcohol/week: 0.0 standard drinks  . Drug use: No  . Sexual activity: Not on file  Other Topics Concern  . Not on file  Social History Narrative  . Not on file   Social Determinants of Health   Financial Resource Strain: Not on file  Food Insecurity: Not on file  Transportation Needs: Not on file  Physical Activity: Not on file  Stress: Not on file  Social Connections: Not on file  Intimate Partner Violence: Not on file    Review of Systems: See HPI,  otherwise negative ROS  Physical Exam: BP (!) 138/91   Pulse (!) 56   Temp (!) 96.3 F (35.7 C) (Temporal)   Resp 20   Ht 5\' 8"  (1.727 m)   Wt 80.3 kg   SpO2 99%   BMI 26.91 kg/m  General:   Alert,  pleasant and cooperative in NAD Head:  Normocephalic and atraumatic. Neck:  Supple; no masses or thyromegaly. Lungs:  Clear throughout to auscultation.    Heart:  Regular rate and rhythm. Abdomen:  Soft, nontender and nondistended. Normal bowel sounds, without guarding, and without rebound.   Neurologic:  Alert and  oriented x4;  grossly normal neurologically.  Impression/Plan: Chad Cardenas is here for an colonoscopy to be performed for h/o crohn's  disease  Risks, benefits, limitations, and alternatives regarding  colonoscopy have been reviewed with the patient.  Questions have been answered.  All parties agreeable.   Algie Coffer, MD  08/21/2020, 8:13 AM

## 2020-08-21 NOTE — Anesthesia Postprocedure Evaluation (Signed)
Anesthesia Post Note  Patient: Chad Cardenas  Procedure(s) Performed: COLONOSCOPY WITH PROPOFOL (N/A )  Patient location during evaluation: Phase II Anesthesia Type: General Level of consciousness: awake and alert, awake and oriented Pain management: pain level controlled Vital Signs Assessment: post-procedure vital signs reviewed and stable Respiratory status: spontaneous breathing, nonlabored ventilation and respiratory function stable Cardiovascular status: blood pressure returned to baseline and stable Postop Assessment: no apparent nausea or vomiting Anesthetic complications: no   No complications documented.   Last Vitals:  Vitals:   08/21/20 0843 08/21/20 0853  BP: (!) 100/55 126/78  Pulse: 60 (!) 46  Resp: 17 11  Temp:    SpO2: 98% 98%    Last Pain:  Vitals:   08/21/20 0853  TempSrc:   PainSc: 0-No pain                 Manfred Arch

## 2020-08-22 ENCOUNTER — Encounter: Payer: Self-pay | Admitting: Gastroenterology

## 2020-08-22 LAB — SURGICAL PATHOLOGY

## 2020-08-29 ENCOUNTER — Other Ambulatory Visit: Payer: Self-pay | Admitting: Gastroenterology

## 2020-08-29 ENCOUNTER — Other Ambulatory Visit: Payer: Self-pay

## 2020-08-29 ENCOUNTER — Encounter: Payer: Self-pay | Admitting: Gastroenterology

## 2020-08-29 DIAGNOSIS — K5010 Crohn's disease of large intestine without complications: Secondary | ICD-10-CM

## 2020-09-04 ENCOUNTER — Telehealth: Payer: Self-pay | Admitting: Gastroenterology

## 2020-09-04 NOTE — Telephone Encounter (Signed)
Called [facility] company they say that medication is approved for a 30 day supply just not a 90 day supply. Called the pharmacy and informed them of this information

## 2020-09-04 NOTE — Telephone Encounter (Signed)
Huntley Dec from [phone] stated that patient's medication needs Prior Authorization patient's insurance plan has changed [phone].

## 2020-09-05 ENCOUNTER — Telehealth: Payer: Self-pay | Admitting: Gastroenterology

## 2020-09-05 NOTE — Telephone Encounter (Signed)
I called them back and they are looking in to why medication is not going through. The medication Is approved

## 2020-09-05 NOTE — Telephone Encounter (Signed)
Debra at [facility] asked for call back for [facility]. [phone]

## 2020-09-12 ENCOUNTER — Encounter: Payer: Self-pay | Admitting: Gastroenterology

## 2020-09-12 NOTE — Telephone Encounter (Signed)
Called [facility] and they state that they need the patient approval letter for the Humira 40mg  pen faxed to them. Called patient insurance to get the letter fax to and then them. Called on 09/04/2020 and they said medication was approved. Called today patient insurance and they said it need a PA on the medication. Did PA over the phone and marked it as Urgent. Reference number 09/06/2020. Informed patient by [id number]

## 2020-09-12 NOTE — Telephone Encounter (Signed)
Medication through [facility] was approved till 04/14/2021. Called [facility] and they got a paid claim for a 100 dollar copay. Called the pharmacy and he verbalized understanding and states he will call the pharmacy and get it scheduled He said send him a mychart message with there number

## 2020-09-27 DIAGNOSIS — D849 Immunodeficiency, unspecified: Secondary | ICD-10-CM | POA: Insufficient documentation

## 2020-09-29 ENCOUNTER — Ambulatory Visit: Payer: Self-pay | Admitting: Gastroenterology

## 2020-10-04 ENCOUNTER — Other Ambulatory Visit: Payer: Self-pay

## 2020-10-06 ENCOUNTER — Ambulatory Visit: Payer: Medicare PPO | Admitting: Gastroenterology

## 2020-10-06 ENCOUNTER — Encounter: Payer: Self-pay | Admitting: Gastroenterology

## 2020-10-06 ENCOUNTER — Other Ambulatory Visit: Payer: Self-pay

## 2020-10-06 VITALS — BP 142/90 | HR 64 | Temp 97.4°F | Ht 67.0 in | Wt 176.5 lb

## 2020-10-06 DIAGNOSIS — K760 Fatty (change of) liver, not elsewhere classified: Secondary | ICD-10-CM | POA: Diagnosis not present

## 2020-10-06 DIAGNOSIS — K5010 Crohn's disease of large intestine without complications: Secondary | ICD-10-CM | POA: Diagnosis not present

## 2020-10-06 NOTE — Progress Notes (Signed)
Chad Repress, MD [address]  [address]  [place], [place] [address]  Main: [phone]  Fax: [phone]    Gastroenterology Consultation  Referring Provider:     Lynnea Ferrier, MD Primary Care Physician:  Lynnea Ferrier, MD Primary Gastroenterologist:  Dr. Mechele Collin Reason for Consultation: Crohn's disease        HPI:   ARLO BUTT is a 65 y.o. male referred by Dr. Graciela Husbands, Susanne Borders III, MD  for consultation & management of Crohn's disease  Patient has history of colonic Crohn's diagnosed in 1980s s/p partial colectomy, currently maintained on Humira biweekly, in clinical remission.  Patient switched his care from [facility] clinic gastroenterology to [facility].  Patient reports his normal state of health, tolerating Humira well, reports having chronic diarrhea since colectomy.  He has not tried any Imodium.  Follow-up visit 12/09/2019 Patient reports he has been doing significantly better with regards to diarrhea.  He is on Questran 4 g twice daily with 50% improvement in frequency, decreased from 8 to 10/day to 4 during the day and 1 at night.  He has been gaining weight.  He is also concerned about acid reflux.  He is currently taking Protonix 40 mg 2 times daily, increased from once a day.  He reports constant clearing of the throat as well as heartburn.  He reports that he had esophageal dilation in the past, is asking for EGD  Follow-up visit 03/31/2020 Patient is here for follow-up of his Crohn's disease.  He reports doing very well from Crohn's standpoint.  His diarrhea has resolved with occasional episodes of nocturnal diarrhea only.  His stools are more formed, 1-2 times daily.  Patient underwent upper endoscopy as well as colonoscopy in 9/21 which revealed moderately active colitis, also his fecal calprotectin levels were elevated, with Humira trough levels were subtherapeutic.  Therefore, he underwent Humira reinduction followed by Humira weekly dosing.   Patient is tolerating Humira well.  Patient he has been gaining weight due to high calorie intake.  His most recent LFTs on 03/21/2020 revealed mildly elevated transaminases, AST/ALT 48/59.  Follow-up visit 06/01/2020 Patient reports that he has been doing well from Crohn's standpoint.  He reports having 3 bowel movements daily, he thinks he cannot get any better than this.  He takes cholestyramine 1 packet daily.  He is on Humira weekly.  Patient reports that he followed strict diet, lost about 10 pounds.  He went to [place] for 2 weeks, just returned from vacation.  He said, he regained that weight back.  His most recent LFTs still mildly elevated.  Follow-up visit 10/06/2020 Patient feels that he is at his best health.  He is at 164 pounds today and he would like to lose few more pounds.  He swims every day as well as does workout regularly, he is following strict diet as well.  On average, he is having 3 bowel movements during the day and 1 or 2 at night, mostly loose.  Nonbloody.  Patient underwent flexible sigmoidoscopy in 5/22 which revealed normal histology in his colon as well as neoterminal ileum.  There was mild ulceration at the ileocolonic anastomosis.  Currently on weekly Humira.  He is also taking cholestyramine 1 packet in the morning.  He does not have any GI concerns today.  No evidence of anemia and his LFTs are normal  Crohn's disease classification:  Age: 25 to 89 Location:  colonic Behavior:  stricturing  Perianal:  no  IBD diagnosis:1980s   Disease course: He had partial colectomy of the ascending/transverse colon at [facility], then left colectomy 2014 along with ventral hernia repair & lysis of adhesions.  Patient has been maintained on Humira for more than 10 years, biweekly, reports doing well.  He has tolerated Humira well.  He had shingles about 6 months ago.  Currently, experiencing 5-6 nonbloody bowel movements per day and 1 at night since he had partial colectomy. No  evidence of anemia, normal inflammatory markers, fecal calprotectin level was 48 in 08/2018.  Celiac serologies negative.  Immune to hepatitis A and B.  EGD and colonoscopy 9/21 revealed moderately active colitis, elevated fecal calprotectin levels 183, stool cultures negative for infectious etiology.  Humira trough level 3.8, undetectable antibodies.  Humira reinduction followed by weekly dosing since 9/21.  Extra intestinal manifestations: None  IBD surgical history: Partial colectomy of the ascending and transverse colon in 1992, left colectomy in 2014 with ventral hernia repair and lysis of adhesions  Imaging:  MRE none CTE none SBFT none  Procedures: Flexible sigmoidoscopy 08/21/2020 - Hemorrhoids found on perianal exam. - Patent functional end-to-end ileo-colonic anastomosis, characterized by friable mucosa, inflammation and ulceration. - Normal mucosa in the sigmoid colon. Biopsied. - Mucosal ulceration.  DIAGNOSIS:  A. NEO-TERMINAL ILEUM; COLD BIOPSY:  - ILEAL MUCOSA WITH INTACT VILLI AND MILD NON-SPECIFIC INTRAEPITHELIAL  LYMPHOCYTOSIS, SEE COMMENT.  - NEGATIVE FOR ACTIVE INFLAMMATION, INFECTIOUS AGENTS, AND GRANULOMAS.   B. COLON; RANDOM COLD BIOPSY  - COLONIC MUCOSA WITH INTACT CRYPT ARCHITECTURE.  - NEGATIVE FOR ACTIVE COLITIS, MICROSCOPIC COLITIS, DYSPLASIA, AND  MALIGNANCY.   Endoscopies done 01/07/19. EGD: reflux esophagitis. Small hiatal hernia. Mild gastritis. No barretts, dysplasia, malignancy. Colonoscopy: ulcerated ileocolonic anastomosis. Ileum otherwise appeared normal as did the rest of the colon. Biopsies of the colon were negative with the exception ofacute inflammation but no dysplasia malignancy at the ileocolonic anastomosis. 5y repeat recommended.  EGD and colonoscopy 12/30/2019 - Patent functional end-to-end ileo-colonic anastomosis, characterized by inflammation. - Aphtha in the entire examined colon. Biopsied. - Patchy moderate inflammation was found  in the rectum secondary to proctitis. - The examined portion of the ileum was normal.  - Normal examined duodenum. - Normal stomach. - Esophagogastric landmarks identified. - Salmon-colored mucosa suspicious for Barrett's esophagus. Biopsied. - Normal esophagus.  DIAGNOSIS:  A. GASTROESOPHAGEAL JUNCTION,; MUCOSA; COLD BIOPSY:  - SQUAMOCOLUMNAR MUCOSA WITH FEATURES OF REFLUX GASTROESOPHAGITIS.  - NEGATIVE FOR INTESTINAL METAPLASIA, DYSPLASIA, AND MALIGNANCY.   B. COLON, LEFT; COLD BIOPSY:  - CHRONIC COLITIS WITH MODERATE ACTIVITY (CRYPTITIS, CRYPT ABSCESSES,  WITH POSSIBLE SUPERFICIAL ULCERATION).  - NEGATIVE FOR GRANULOMA, DYSPLASIA, AND MALIGNANCY.   VCE none  IBD medications:  Steroids: None 5-ASA: no mesalamine Immunomodulators: AZA, methotrexate none TPMT status unknown Biologics: Anti TNFs: Humira biweekly for more than 15 years Anti Integrins: Ustekinumab: Tofactinib: Clinical trial:   Past Medical History:  Diagnosis Date   Arthritis    Crohn's disease (HCC)    Fatty liver disease, nonalcoholic    GERD (gastroesophageal reflux disease)    History of kidney stones    in the past   Hypertension    Sleep apnea    Use C-PAP    Past Surgical History:  Procedure Laterality Date   COLON SURGERY  2014   Partial Colectomy   COLONOSCOPY WITH PROPOFOL N/A 12/30/2019   Procedure: COLONOSCOPY WITH BIOPSY;  Surgeon: Toney Reil, MD;  Location: [facility];  Service: Endoscopy;  Laterality: N/A;  Previous  colectomy, extent reached at 0923    COLONOSCOPY WITH PROPOFOL N/A 08/21/2020   Procedure: COLONOSCOPY WITH PROPOFOL;  Surgeon: Toney ReilVanga, Erling Arrazola Reddy, MD;  [facility];  Service: Gastroenterology;  Laterality: N/A;   ESOPHAGOGASTRODUODENOSCOPY (EGD) WITH PROPOFOL N/A 12/30/2019   Procedure: ESOPHAGOGASTRODUODENOSCOPY (EGD) WITH BIOPSY;  Surgeon: Toney ReilVanga, Hikaru Delorenzo Reddy, MD;  [facility];  Service: Endoscopy;  Laterality: N/A;   requests early appt   HERNIA REPAIR  2014   Ventral Hernia Repair   VENTRAL HERNIA REPAIR N/A 07/04/2016   Procedure: HERNIA REPAIR VENTRAL ADULT;  Surgeon: Nadeen LandauJarvis Wilton Smith, MD;  Location: [facility] OR[facility];  Service: General;  Laterality: N/A;    Current Outpatient Medications:    cholestyramine (QUESTRAN) 4 g packet, MIX 1 PACKET (4 G TOTAL) IN LIQUID OF CHOICE BY MOUTH 2 TIMES DAILY., Disp: 180 packet, Rfl: 1   fenofibrate 160 MG tablet, Take by mouth., Disp: , Rfl:    HUMIRA PEN 40 MG/0.4ML PNKT, INJECT 1 PEN UNDER THE SKIN EVERY 7 DAYS., Disp: 4 each, Rfl: 3   hydrochlorothiazide (HYDRODIURIL) 25 MG tablet, Take by mouth., Disp: , Rfl:    losartan (COZAAR) 50 MG tablet, Take by mouth., Disp: , Rfl:    pantoprazole (PROTONIX) 40 MG tablet, Take 40 mg by mouth daily., Disp: , Rfl:    Family History  Problem Relation Age of Onset   Heart attack Mother    Hypertension Father      Social History   Tobacco Use   Smoking status: Never   Smokeless tobacco: Never  Vaping Use   Vaping Use: Never used  Substance Use Topics   Alcohol use: No    Alcohol/week: 0.0 standard drinks   Drug use: No    Allergies as of 10/06/2020   (No Known Allergies)    Review of Systems:    All systems reviewed and negative except where noted in HPI.   Physical Exam:  BP (!) 142/90 (BP Location: Left Arm, Patient Position: Sitting, Cuff Size: Normal)   Pulse 64   Temp (!) 97.4 F (36.3 C) (Oral)   Ht 5\' 7"  (1.702 m)   Wt 176 lb 8 oz (80.1 kg)   BMI 27.64 kg/m  No LMP for male patient.  General:   Alert,  Well-developed, well-nourished, pleasant and cooperative in NAD Head:  Normocephalic and atraumatic. Eyes:  Sclera clear, no icterus.   Conjunctiva pink. Ears:  Normal auditory acuity. Nose:  No deformity, discharge, or lesions. Mouth:  No deformity or lesions,oropharynx pink & moist. Neck:  Supple; no masses or thyromegaly. Lungs:  Respirations even and unlabored.  Clear throughout to  auscultation.   No wheezes, crackles, or rhonchi. No acute distress. Heart:  Regular rate and rhythm; no murmurs, clicks, rubs, or gallops. Abdomen:  Normal bowel sounds. Soft, vertical scar in midline, well-healed, non-tender and non-distended without masses, hepatosplenomegaly or hernias noted.  No guarding or rebound tenderness.   Rectal: Not performed Msk:  Symmetrical without gross deformities. Good, equal movement & strength bilaterally. Pulses:  Normal pulses noted. Extremities:  No clubbing or edema.  No cyanosis. Neurologic:  Alert and oriented x3;  grossly normal neurologically. Skin:  Intact without significant lesions or rashes. No jaundice. Psych:  Alert and cooperative. Normal mood and affect.  Imaging Studies: Reviewed  Assessment and Plan:   Algie CofferRaymond L Hartis is a 65 y.o. male with history of hypertension, chronic GERD, Crohn's colitis diagnosed in 131980s s/p partial colectomy in 1992 and 2014, with moderately  active colitis based on colonoscopy 9/21, subtherapeutic Humira levels, elevated fecal calprotectin levels, s/p Humira reinduction and increased maintenance to weekly  Crohn's colitis, s/p ileocolonic anastomosis: In clinical and histologic remission Continue Humira weekly, monotherapy Fecal calprotectin levels are still elevated, slightly improved from 183, 5 months ago to 146, 1 month ago Recheck levels Increase cholestyramine to 2 times daily for chronic diarrhea which is likely secondary to subtotal colectomy Continue weekly Humira in May 2022 Adalimumab drug levels are currently therapeutic at 7.8, undetectable antibodies as of 04/03/2020  Chronic GERD EGD unremarkable for Barrett's esophagus Continue Protonix 40 mg once daily before meals  IBD Health Maintenance  1.TB status: QuantiFERON gold negative as of 06/01/2020 2. Anemia: Not present 3.Immunizations: Hep A and B immune, recommend annual influenza vaccine, patient received prevnar at [facility],  given him the prescription for pneumovax.  Patient had shingles 1 year ago.  He received first dose of Shingrix vaccine, received second dose in 06/2020 4.Cancer screening I) Colon cancer/dysplasia surveillance: Last colonoscopy 12/2019, repeat colonoscopy in 5/22 II) Skin cancer - counseled about annual skin exam by dermatology and skin protection in summer using sun screen SPF > 50, clothing 5.Bone health Vitamin D status: normal  Bone density testing: n/a 5. Labs: Every 3 months 6. Smoking: n/a 7. NSAIDs and Antibiotics use: n/a  Elevated LFTs, most likely secondary to fatty liver with recent weight gain Ultrasound liver with elastography in 6/20 revealed F0-F1 Reiterated on low-fat diet, regular exercise LFTs are currently normal   Follow up in 6 months   Chad Repress, MD

## 2020-10-10 LAB — CALPROTECTIN, FECAL: Calprotectin, Fecal: 382 ug/g — ABNORMAL HIGH (ref 0–120)

## 2020-10-18 ENCOUNTER — Encounter: Payer: Self-pay | Admitting: Gastroenterology

## 2020-12-07 ENCOUNTER — Other Ambulatory Visit: Payer: Self-pay | Admitting: Gastroenterology

## 2021-03-30 ENCOUNTER — Other Ambulatory Visit: Payer: Self-pay

## 2021-03-30 ENCOUNTER — Ambulatory Visit (INDEPENDENT_AMBULATORY_CARE_PROVIDER_SITE_OTHER): Payer: Medicare PPO | Admitting: Gastroenterology

## 2021-03-30 ENCOUNTER — Encounter: Payer: Self-pay | Admitting: Gastroenterology

## 2021-03-30 VITALS — BP 162/79 | HR 75 | Temp 97.5°F | Ht 67.0 in | Wt 194.6 lb

## 2021-03-30 DIAGNOSIS — Z9049 Acquired absence of other specified parts of digestive tract: Secondary | ICD-10-CM | POA: Diagnosis not present

## 2021-03-30 DIAGNOSIS — K5010 Crohn's disease of large intestine without complications: Secondary | ICD-10-CM | POA: Diagnosis not present

## 2021-03-30 NOTE — Progress Notes (Signed)
[name], MD [address]  [place], [place] [address]  Main: [phone]  Fax: [phone]    Gastroenterology Consultation  Referring Provider:     Adin Hector, MD Primary Care Physician:  Adin Hector, MD Primary Gastroenterologist:  Dr. Vira Agar Reason for Consultation: Crohn's disease        HPI:   Chad Cardenas is a 65 y.o. male referred by Dr. Caryl Comes, Wendelyn Breslow III, MD  for consultation & management of Crohn's disease  Patient has history of colonic Crohn's diagnosed in 1980s s/p partial colectomy, currently maintained on Humira biweekly, in clinical remission.  Patient switched his care from [facility] gastroenterology to [facility].  Patient reports his normal state of health, tolerating Humira well, reports having chronic diarrhea since colectomy.  He has not tried any Imodium.  Follow-up visit 12/09/2019 Patient reports he has been doing significantly better with regards to diarrhea.  He is on Questran 4 g twice daily with 50% improvement in frequency, decreased from 8 to 10/day to 4 during the day and 1 at night.  He has been gaining weight.  He is also concerned about acid reflux.  He is currently taking Protonix 40 mg 2 times daily, increased from once a day.  He reports constant clearing of the throat as well as heartburn.  He reports that he had esophageal dilation in the past, is asking for EGD  Follow-up visit 03/31/2020 Patient is here for follow-up of his Crohn's disease.  He reports doing very well from Crohn's standpoint.  His diarrhea has resolved with occasional episodes of nocturnal diarrhea only.  His stools are more formed, 1-2 times daily.  Patient underwent upper endoscopy as well as colonoscopy in 9/21 which revealed moderately active colitis, also his fecal calprotectin levels were elevated, with Humira trough levels were subtherapeutic.  Therefore, he underwent Humira reinduction followed by Humira weekly dosing.   Patient is tolerating Humira well.  Patient he has been gaining weight due to high calorie intake.  His most recent LFTs on 03/21/2020 revealed mildly elevated transaminases, AST/ALT 48/59.  Follow-up visit 06/01/2020 Patient reports that he has been doing well from Crohn's standpoint.  He reports having 3 bowel movements daily, he thinks he cannot get any better than this.  He takes cholestyramine 1 packet daily.  He is on Humira weekly.  Patient reports that he followed strict diet, lost about 10 pounds.  He went to [place] for 2 weeks, just returned from vacation.  He said, he regained that weight back.  His most recent LFTs still mildly elevated.  Follow-up visit 10/06/2020 Patient feels that he is at his best health.  He is at 164 pounds today and he would like to lose few more pounds.  He swims every day as well as does workout regularly, he is following strict diet as well.  On average, he is having 3 bowel movements during the day and 1 or 2 at night, mostly loose.  Nonbloody.  Patient underwent flexible sigmoidoscopy in 5/22 which revealed normal histology in his colon as well as neoterminal ileum.  There was mild ulceration at the ileocolonic anastomosis.  Currently on weekly Humira.  He is also taking cholestyramine 1 packet in the morning.  He does not have any GI concerns today.  No evidence of anemia and his LFTs are normal  Follow-up visit 03/30/2021 Chad Cardenas is here for follow-up of Crohn's disease.  Since last visit,  he has gained 20 pounds back due to decreased activity and increased p.o. intake.  He also reports that his symptoms have worsened.  He reports having 10 watery bowel movements daily.  Patient denies any rectal bleeding.  His fecal calprotectin levels continue to rise.  He did not check MyChart message about repeat flexible sigmoidoscopy.  Crohn's disease classification:  Age: 15 to 16 Location:  colonic Behavior:  stricturing  Perianal: no  IBD  diagnosis:1980s   Disease course: He had partial colectomy of the ascending/transverse colon at [facility], then left colectomy 2014 along with ventral hernia repair & lysis of adhesions.  Patient has been maintained on Humira for more than 10 years, biweekly, reports doing well.  He has tolerated Humira well.  He had shingles about 6 months ago.  Currently, experiencing 5-6 nonbloody bowel movements per day and 1 at night since he had partial colectomy. No evidence of anemia, normal inflammatory markers, fecal calprotectin level was 48 in 08/2018.  Celiac serologies negative.  Immune to hepatitis A and B.  EGD and colonoscopy 9/21 revealed moderately active colitis, elevated fecal calprotectin levels 183, stool cultures negative for infectious etiology.  Humira trough level 3.8, undetectable antibodies.  Humira reinduction followed by weekly dosing since 9/21.  Extra intestinal manifestations: None  IBD surgical history: Partial colectomy of the ascending and transverse colon in 1992, left colectomy in 2014 with ventral hernia repair and lysis of adhesions  Imaging:  MRE none CTE none SBFT none  Procedures: Flexible sigmoidoscopy 08/21/2020 - Hemorrhoids found on perianal exam. - Patent functional end-to-end ileo-colonic anastomosis, characterized by friable mucosa, inflammation and ulceration. - Normal mucosa in the sigmoid colon. Biopsied. - Mucosal ulceration.  DIAGNOSIS:  A. NEO-TERMINAL ILEUM; COLD BIOPSY:  - ILEAL MUCOSA WITH INTACT VILLI AND MILD NON-SPECIFIC INTRAEPITHELIAL  LYMPHOCYTOSIS, SEE COMMENT.  - NEGATIVE FOR ACTIVE INFLAMMATION, INFECTIOUS AGENTS, AND GRANULOMAS.   B. COLON; RANDOM COLD BIOPSY  - COLONIC MUCOSA WITH INTACT CRYPT ARCHITECTURE.  - NEGATIVE FOR ACTIVE COLITIS, MICROSCOPIC COLITIS, DYSPLASIA, AND  MALIGNANCY.   Endoscopies done 01/07/19. EGD: reflux esophagitis. Small hiatal hernia. Mild gastritis. No barretts, dysplasia, malignancy. Colonoscopy:  ulcerated ileocolonic anastomosis. Ileum otherwise appeared normal as did the rest of the colon. Biopsies of the colon were negative with the exception ofacute inflammation but no dysplasia malignancy at the ileocolonic anastomosis. 5y repeat recommended.  EGD and colonoscopy 12/30/2019 - Patent functional end-to-end ileo-colonic anastomosis, characterized by inflammation. - Aphtha in the entire examined colon. Biopsied. - Patchy moderate inflammation was found in the rectum secondary to proctitis. - The examined portion of the ileum was normal.  - Normal examined duodenum. - Normal stomach. - Esophagogastric landmarks identified. - Salmon-colored mucosa suspicious for Barrett's esophagus. Biopsied. - Normal esophagus.  DIAGNOSIS:  A. GASTROESOPHAGEAL JUNCTION,; MUCOSA; COLD BIOPSY:  - SQUAMOCOLUMNAR MUCOSA WITH FEATURES OF REFLUX GASTROESOPHAGITIS.  - NEGATIVE FOR INTESTINAL METAPLASIA, DYSPLASIA, AND MALIGNANCY.   B. COLON, LEFT; COLD BIOPSY:  - CHRONIC COLITIS WITH MODERATE ACTIVITY (CRYPTITIS, CRYPT ABSCESSES,  WITH POSSIBLE SUPERFICIAL ULCERATION).  - NEGATIVE FOR GRANULOMA, DYSPLASIA, AND MALIGNANCY.   VCE none  IBD medications:  Steroids: None 5-ASA: no mesalamine Immunomodulators: AZA, methotrexate none TPMT status unknown Biologics: Anti TNFs: Humira biweekly for more than 15 years Anti Integrins: Ustekinumab: Tofactinib: Clinical trial:   Past Medical History:  Diagnosis Date   Arthritis    Crohn's disease ([place])    Fatty liver disease, nonalcoholic    GERD (gastroesophageal reflux disease)    History  of kidney stones    in the past   Hypertension    Sleep apnea    Use C-PAP    Past Surgical History:  Procedure Laterality Date   COLON SURGERY  2014   Partial Colectomy   COLONOSCOPY WITH PROPOFOL N/A 12/30/2019   Procedure: COLONOSCOPY WITH BIOPSY;  Surgeon: Toney Reil, MD;  Location: [facility];  Service: Endoscopy;  Laterality: N/A;   Previous colectomy, extent reached at 0923    COLONOSCOPY WITH PROPOFOL N/A 08/21/2020   Procedure: COLONOSCOPY WITH PROPOFOL;  Surgeon: Toney Reil, MD;  Location: [facility] ENDOSCOPY;  Service: Gastroenterology;  Laterality: N/A;   ESOPHAGOGASTRODUODENOSCOPY (EGD) WITH PROPOFOL N/A 12/30/2019   Procedure: ESOPHAGOGASTRODUODENOSCOPY (EGD) WITH BIOPSY;  Surgeon: Toney Reil, MD;  Location: [facility];  Service: Endoscopy;  Laterality: N/A;  requests early appt   HERNIA REPAIR  2014   Ventral Hernia Repair   VENTRAL HERNIA REPAIR N/A 07/04/2016   Procedure: HERNIA REPAIR VENTRAL ADULT;  Surgeon: Nadeen Landau, MD;  Location: [facility] ORS;  Service: General;  Laterality: N/A;    Current Outpatient Medications:    cholestyramine (QUESTRAN) 4 g packet, MIX 1 PACKET (4 G TOTAL) IN LIQUID OF CHOICE BY MOUTH 2 TIMES DAILY., Disp: 180 packet, Rfl: 1   HUMIRA PEN 40 MG/0.4ML PNKT, INJECT 1 PEN UNDER THE SKIN EVERY 7 DAYS., Disp: 4 each, Rfl: 3   pantoprazole (PROTONIX) 40 MG tablet, Take 40 mg by mouth daily., Disp: , Rfl:    brimonidine (ALPHAGAN) 0.2 % ophthalmic solution, Place 1 drop into the right eye 4 (four) times daily., Disp: , Rfl:    fenofibrate 160 MG tablet, Take by mouth., Disp: , Rfl:    hydrochlorothiazide (HYDRODIURIL) 25 MG tablet, Take by mouth., Disp: , Rfl:    losartan (COZAAR) 50 MG tablet, Take by mouth., Disp: , Rfl:    Family History  Problem Relation Age of Onset   Heart attack Mother    Hypertension Father      Social History   Tobacco Use   Smoking status: Never   Smokeless tobacco: Never  Vaping Use   Vaping Use: Never used  Substance Use Topics   Alcohol use: No    Alcohol/week: 0.0 standard drinks   Drug use: No    Allergies as of 03/30/2021   (No Known Allergies)    Review of Systems:    All systems reviewed and negative except where noted in HPI.   Physical Exam:  BP (!) 162/79 (BP Location: Left Arm, Patient Position:  Sitting, Cuff Size: Large)    Pulse 75    Temp (!) 97.5 F (36.4 C) (Temporal)    Ht 5\' 7"  (1.702 m)    Wt 194 lb 9.6 oz (88.3 kg)    BMI 30.48 kg/m  No LMP for male patient.  General:   Alert,  Well-developed, well-nourished, pleasant and cooperative in NAD Head:  Normocephalic and atraumatic. Eyes:  Sclera clear, no icterus.   Conjunctiva pink. Ears:  Normal auditory acuity. Nose:  No deformity, discharge, or lesions. Mouth:  No deformity or lesions,oropharynx pink & moist. Neck:  Supple; no masses or thyromegaly. Lungs:  Respirations even and unlabored.  Clear throughout to auscultation.   No wheezes, crackles, or rhonchi. No acute distress. Heart:  Regular rate and rhythm; no murmurs, clicks, rubs, or gallops. Abdomen:  Normal bowel sounds. Soft, vertical scar in midline, well-healed, non-tender and non-distended without masses, hepatosplenomegaly or hernias noted.  No  guarding or rebound tenderness.   Rectal: Not performed Msk:  Symmetrical without gross deformities. Good, equal movement & strength bilaterally. Pulses:  Normal pulses noted. Extremities:  No clubbing or edema.  No cyanosis. Neurologic:  Alert and oriented x3;  grossly normal neurologically. Skin:  Intact without significant lesions or rashes. No jaundice. Psych:  Alert and cooperative. Normal mood and affect.  Imaging Studies: Reviewed  Assessment and Plan:   Chad Cardenas is a 65 y.o. male with history of hypertension, chronic GERD, Crohn's colitis diagnosed in 50s s/p partial colectomy in 1992 and 2014, with moderately active colitis based on colonoscopy 9/21, subtherapeutic Humira levels, elevated fecal calprotectin levels, s/p Humira reinduction and increased maintenance to weekly  Crohn's colitis, s/p ileocolonic anastomosis: not In clinical and histologic remission I am concerned about secondary loss of response to Humira Continue Humira weekly, monotherapy Elevated fecal calprotectin levels  Continue  cholestyramine to 2 times daily for chronic diarrhea which is partly secondary to subtotal colectomy Adalimumab drug levels are currently therapeutic at 7.8, undetectable antibodies as of 04/03/2020 Recommend repeat flexible sigmoidoscopy with biopsies.  I have also discussed with patient regarding change of therapy, likely to [facility], information provided  Chronic GERD EGD unremarkable for Barrett's esophagus Continue Protonix 40 mg once daily before meals  IBD Health Maintenance  1.TB status: QuantiFERON gold negative as of 06/01/2020 2. Anemia: Not present 3.Immunizations: Hep A and B immune, recommend annual influenza vaccine, patient received prevnar at [facility], given him the prescription for pneumovax.  Patient had shingles 1 year ago.  He received first dose of Shingrix vaccine, received second dose in 06/2020 4.Cancer screening I) Colon cancer/dysplasia surveillance: Last colonoscopy 12/2019, repeat colonoscopy in 5/22 II) Skin cancer - counseled about annual skin exam by dermatology and skin protection in summer using sun screen SPF > 50, clothing 5.Bone health Vitamin D status: normal  Bone density testing: n/a 5. Labs: Every 3 months, patient had labs performed by his PCP about a week ago, reportedly normal.  We will obtain a copy from his office 6. Smoking: n/a 7. NSAIDs and Antibiotics use: n/a  Elevated LFTs, most likely secondary to fatty liver with recent weight gain Ultrasound liver with elastography in 6/20 revealed F0-F1 Reiterated on low-fat diet, regular exercise LFTs are currently normal   Follow up in 3 months   [name], MD

## 2021-04-05 ENCOUNTER — Telehealth: Payer: Self-pay

## 2021-04-05 NOTE — Telephone Encounter (Signed)
-----   Message from Toney Reil, MD sent at 03/30/2021  2:13 PM EST ----- Regarding: [facility]  Patient states that he had labs done by his PCP very recently.  Can you please call his PCP, Dr. Daniel Nones to get a copy of CBC, LFTs, BMP  Thanks RV

## 2021-04-05 NOTE — Telephone Encounter (Signed)
Called and asked Dr Graciela Husbands office to request results for cbc lft and bmp patient will need to come in and sign a release form for [place] to be able to get per receptionist

## 2021-04-05 NOTE — Telephone Encounter (Signed)
Patient will come by and sign release form so we can get test results

## 2021-04-06 ENCOUNTER — Telehealth: Payer: Self-pay

## 2021-04-06 NOTE — Telephone Encounter (Signed)
-----   Message from Toney Reil, MD sent at 03/30/2021  2:13 PM EST ----- Regarding: [facility]  Patient states that he had labs done by his PCP very recently.  Can you please call his PCP, Dr. Daniel Nones to get a copy of CBC, LFTs, BMP  Thanks RV

## 2021-04-06 NOTE — Telephone Encounter (Signed)
Patient came in signed a release I faxed it over to his pcp asking for labs waiting for response from pcp now

## 2021-04-13 ENCOUNTER — Ambulatory Visit: Payer: Medicare PPO | Admitting: Gastroenterology

## 2021-04-18 ENCOUNTER — Other Ambulatory Visit: Payer: Self-pay

## 2021-04-18 ENCOUNTER — Encounter: Payer: Self-pay | Admitting: Gastroenterology

## 2021-04-19 ENCOUNTER — Encounter: Payer: Self-pay | Admitting: Anesthesiology

## 2021-05-03 ENCOUNTER — Encounter
Admission: RE | Disposition: A | Discharge status: Home / Self Care | Payer: Self-pay | Source: Home / Self Care | Attending: Gastroenterology

## 2021-05-03 ENCOUNTER — Other Ambulatory Visit: Payer: Self-pay

## 2021-05-03 ENCOUNTER — Ambulatory Visit
Admission: RE | Admit: 2021-05-03 | Discharge: 2021-05-03 | Disposition: A | Discharge status: Home / Self Care | Payer: Medicare PPO | Attending: Gastroenterology | Admitting: Gastroenterology

## 2021-05-03 ENCOUNTER — Encounter: Payer: Self-pay | Admitting: Gastroenterology

## 2021-05-03 DIAGNOSIS — K5000 Crohn's disease of small intestine without complications: Secondary | ICD-10-CM | POA: Insufficient documentation

## 2021-05-03 DIAGNOSIS — I10 Essential (primary) hypertension: Secondary | ICD-10-CM | POA: Diagnosis not present

## 2021-05-03 DIAGNOSIS — M1990 Unspecified osteoarthritis, unspecified site: Secondary | ICD-10-CM | POA: Insufficient documentation

## 2021-05-03 DIAGNOSIS — Z980 Intestinal bypass and anastomosis status: Secondary | ICD-10-CM | POA: Insufficient documentation

## 2021-05-03 DIAGNOSIS — K219 Gastro-esophageal reflux disease without esophagitis: Secondary | ICD-10-CM | POA: Insufficient documentation

## 2021-05-03 DIAGNOSIS — K633 Ulcer of intestine: Secondary | ICD-10-CM | POA: Diagnosis not present

## 2021-05-03 DIAGNOSIS — G4730 Sleep apnea, unspecified: Secondary | ICD-10-CM | POA: Insufficient documentation

## 2021-05-03 DIAGNOSIS — Z9049 Acquired absence of other specified parts of digestive tract: Secondary | ICD-10-CM | POA: Insufficient documentation

## 2021-05-03 DIAGNOSIS — K760 Fatty (change of) liver, not elsewhere classified: Secondary | ICD-10-CM | POA: Insufficient documentation

## 2021-05-03 DIAGNOSIS — Z79899 Other long term (current) drug therapy: Secondary | ICD-10-CM | POA: Insufficient documentation

## 2021-05-03 DIAGNOSIS — K5010 Crohn's disease of large intestine without complications: Secondary | ICD-10-CM

## 2021-05-03 DIAGNOSIS — K529 Noninfective gastroenteritis and colitis, unspecified: Secondary | ICD-10-CM

## 2021-05-03 HISTORY — PX: FLEXIBLE SIGMOIDOSCOPY: SHX5431

## 2021-05-03 SURGERY — SIGMOIDOSCOPY, FLEXIBLE
Anesthesia: Choice | Site: Rectum

## 2021-05-03 MED ORDER — SODIUM CHLORIDE 0.9 % IV SOLN: INTRAVENOUS | Status: DC

## 2021-05-03 MED ORDER — STERILE WATER FOR IRRIGATION IR SOLN
Status: DC | PRN
  Administered 2021-05-03: 250 mL

## 2021-05-03 MED ORDER — LACTATED RINGERS IV SOLN: INTRAVENOUS | Status: DC

## 2021-05-03 SURGICAL SUPPLY — 6 items
GOWN CVR UNV OPN BCK APRN NK (MISCELLANEOUS) ×2 IMPLANT
GOWN ISOL THUMB LOOP REG UNIV (MISCELLANEOUS) ×4
KIT PRC NS LF DISP ENDO (KITS) ×1 IMPLANT
KIT PROCEDURE OLYMPUS (KITS) ×2
MANIFOLD NEPTUNE II (INSTRUMENTS) ×2 IMPLANT
WATER STERILE IRR 250ML POUR (IV SOLUTION) ×2 IMPLANT

## 2021-05-03 NOTE — Progress Notes (Signed)
Per Dr. Allegra Lai to order stool test for chronic diarrhea

## 2021-05-03 NOTE — Anesthesia Preprocedure Evaluation (Deleted)
Anesthesia Evaluation  Patient identified by MRN, date of birth, ID band Patient awake    Reviewed: Allergy & Precautions, NPO status   Airway      Mouth opening: Pediatric Airway  Dental   Pulmonary sleep apnea and Continuous Positive Airway Pressure Ventilation ,    breath sounds clear to auscultation       Cardiovascular hypertension,  Rhythm:Regular Rate:Normal     Neuro/Psych    GI/Hepatic GERD  ,Crohn's disease   Endo/Other    Renal/GU      Musculoskeletal  (+) Arthritis ,   Abdominal   Peds  Hematology   Anesthesia Other Findings   Reproductive/Obstetrics                             Anesthesia Physical Anesthesia Plan  ASA: 2  Anesthesia Plan: General   Post-op Pain Management: Minimal or no pain anticipated   Induction: Intravenous  PONV Risk Score and Plan: Propofol infusion, TIVA and Treatment may vary due to age or medical condition  Airway Management Planned: Natural Airway and Nasal Cannula  Additional Equipment:   Intra-op Plan:   Post-operative Plan:   Informed Consent: I have reviewed the patients History and Physical, chart, labs and discussed the procedure including the risks, benefits and alternatives for the proposed anesthesia with the patient or authorized representative who has indicated his/her understanding and acceptance.       Plan Discussed with: CRNA  Anesthesia Plan Comments:         Anesthesia Quick Evaluation

## 2021-05-03 NOTE — Op Note (Signed)
[facility] Gastroenterology Patient Name: Chad Cardenas Procedure Date: 05/03/2021 11:04 AM MRN: [id number] Account #: [id number] Date of Birth: February 09, 1956 Admit Type: Outpatient Age: 66 Room: [facility] OR ROOM 01 Gender: Male Note Status: Finalized Instrument Name: [id number] Procedure:             Flexible Sigmoidoscopy Indications:           Follow-up of Crohn's disease of the small bowel,                         Disease activity assessment of Crohn's disease of the                         small bowel, Assess therapeutic response to therapy of                         Crohn's disease of the small bowel Providers:             Lin Landsman MD, MD Referring MD:          Ramonita Lab, MD (Referring MD) Medicines:             None Complications:         No immediate complications. Estimated blood loss: None. Procedure:             Pre-Anesthesia Assessment:                        - Prior to the procedure, a History and Physical was                         performed, and patient medications and allergies were                         reviewed. The patient is competent. The risks and                         benefits of the procedure and the sedation options and                         risks were discussed with the patient. All questions                         were answered and informed consent was obtained.                         Patient identification and proposed procedure were                         verified by the physician, the nurse and the                         technician in the pre-procedure area in the procedure                         room in the endoscopy suite. Mental Status                         Examination: alert and oriented. Airway Examination:  normal oropharyngeal airway and neck mobility.                         Respiratory Examination: clear to auscultation. CV                         Examination: normal. Prophylactic  Antibiotics: The                         patient does not require prophylactic antibiotics.                         Prior Anticoagulants: The patient has taken no                         previous anticoagulant or antiplatelet agents. ASA                         Grade Assessment: II - A patient with mild systemic                         disease. After reviewing the risks and benefits, the                         patient was deemed in satisfactory condition to                         undergo the procedure. The anesthesia plan was to use                         no sedation or anesthesia. Immediately prior to                         administration of medications, the patient was                         re-assessed for adequacy to receive sedatives. The                         heart rate, respiratory rate, oxygen saturations,                         blood pressure, adequacy of pulmonary ventilation, and                         response to care were monitored throughout the                         procedure. The physical status of the patient was                         re-assessed after the procedure.                        After obtaining informed consent, the scope was passed                         under direct vision. The Endoscope was introduced  through the anus and advanced to the 30 cm from the                         anal verge. The flexible sigmoidoscopy was                         accomplished without difficulty. The patient tolerated                         the procedure well. The quality of the bowel                         preparation was good. Findings:      The perianal and digital rectal examinations were normal. Pertinent       negatives include normal sphincter tone and no palpable rectal lesions.      There was evidence of a prior functional end-to-end ileo-colonic       anastomosis at 30 cm proximal to the anus. This was patent and was        characterized by friable mucosa and ulceration. The anastomosis was       traversed.      The neoterminal ileum appeared normal.      Discontinuous areas of nonbleeding ulcerated mucosa with no stigmata of       recent bleeding were present in the distal rectum secndary to crohn's,       rest of he coloc mucosa till the anastamosis was normal. Impression:            - Patent functional end-to-end ileo-colonic                         anastomosis, characterized by friable mucosa and                         ulceration.                        - The terminal ileum is normal.                        - Mucosal ulceration in the distal rectum.                        - No specimens collected. Recommendation:        - Discharge patient to home (with spouse).                        - Resume previous diet today.                        - Return to my office as previously scheduled. Procedure Code(s):     --- Professional ---                        [phone], Sigmoidoscopy, flexible; diagnostic, including                         collection of specimen(s) by brushing or washing, when  performed (separate procedure) Diagnosis Code(s):     --- Professional ---                        Z98.0, Intestinal bypass and anastomosis status                        K63.3, Ulcer of intestine                        K50.00, Crohn's disease of small intestine without                         complications CPT copyright 2019 American Medical Association. All rights reserved. The codes documented in this report are preliminary and upon coder review may  be revised to meet current compliance requirements. Dr. Ulyess Mort Lin Landsman MD, MD 05/03/2021 11:21:58 AM This report has been signed electronically. Number of Addenda: 0 Note Initiated On: 05/03/2021 11:04 AM Total Procedure Duration: 0 hours 5 minutes 24 seconds  Estimated Blood Loss:  Estimated blood loss: none.      [facility]

## 2021-05-03 NOTE — OR Nursing (Signed)
1107 Pulse:  66 bpm BP:  140/102 Resp:  18 O2 Sat:  94% on Room Air  1113 Pulse:  63 bpm BP:  130/70 Resp:  18 O2 Sat:  94% on Room Air  1116 Pulse:  67 bpm BP:  100/79 Resp:  17 O2 Sat:  96% on Room Air

## 2021-05-03 NOTE — H&P (Signed)
[name], MD [address]  [place], [place] [address]  Main: [phone]  Fax: [phone] Pager: [phone]  Primary Care Physician:  Adin Hector, MD Primary Gastroenterologist:  Dr. [name]  Pre-Procedure History & Physical: HPI:  Chad Cardenas is a 66 y.o. male is here for an flexible sigmoidoscopy.   Past Medical History:  Diagnosis Date   Arthritis    Crohn's disease ([place])    Fatty liver disease, nonalcoholic    GERD (gastroesophageal reflux disease)    History of kidney stones    in the past   Hypertension    Sleep apnea    Use C-PAP    Past Surgical History:  Procedure Laterality Date   COLON SURGERY  2014   Partial Colectomy   COLONOSCOPY WITH PROPOFOL N/A 12/30/2019   Procedure: COLONOSCOPY WITH BIOPSY;  Surgeon: Lin Landsman, MD;  Location: [facility];  Service: Endoscopy;  Laterality: N/A;  Previous colectomy, extent reached at 0923    COLONOSCOPY WITH PROPOFOL N/A 08/21/2020   Procedure: COLONOSCOPY WITH PROPOFOL;  Surgeon: Lin Landsman, MD;  Location: [facility] ENDOSCOPY;  Service: Gastroenterology;  Laterality: N/A;   ESOPHAGOGASTRODUODENOSCOPY (EGD) WITH PROPOFOL N/A 12/30/2019   Procedure: ESOPHAGOGASTRODUODENOSCOPY (EGD) WITH BIOPSY;  Surgeon: Lin Landsman, MD;  Location: [place] [facility];  Service: Endoscopy;  Laterality: N/A;  requests early appt   HERNIA REPAIR  2014   Ventral Hernia Repair   VENTRAL HERNIA REPAIR N/A 07/04/2016   Procedure: HERNIA REPAIR VENTRAL ADULT;  Surgeon: Leonie Green, MD;  Location: [facility];  Service: General;  Laterality: N/A;    Prior to Admission medications   Medication Sig Start Date End Date Taking? Authorizing Provider  brimonidine (ALPHAGAN) 0.2 % ophthalmic solution Place 1 drop into the right eye 4 (four) times daily. 01/22/21  Yes [provider]  cholestyramine (QUESTRAN) 4 g packet MIX 1 PACKET (4 G TOTAL) IN LIQUID OF CHOICE BY  MOUTH 2 TIMES DAILY. 08/14/20  Yes Kianni Lheureux, Tally Due, MD  HUMIRA PEN 40 MG/0.4ML PNKT INJECT 1 PEN UNDER THE SKIN EVERY 7 DAYS. 12/07/20  Yes Geonna Lockyer, Tally Due, MD  pantoprazole (PROTONIX) 40 MG tablet Take 40 mg by mouth daily.   Yes [provider]  fenofibrate 160 MG tablet Take by mouth. 10/27/19 10/26/20  [provider]  hydrochlorothiazide (HYDRODIURIL) 25 MG tablet Take by mouth. Patient not taking: Reported on 04/18/2021 01/19/19 10/06/20  [provider]  losartan (COZAAR) 50 MG tablet Take by mouth. 05/18/19 10/06/20  [provider]    Allergies as of 03/30/2021   (No Known Allergies)    Family History  Problem Relation Age of Onset   Heart attack Mother    Hypertension Father     Social History   Socioeconomic History   Marital status: Married    Spouse name: Not on file   Number of children: Not on file   Years of education: Not on file   Highest education level: Not on file  Occupational History   Not on file  Tobacco Use   Smoking status: Never   Smokeless tobacco: Never  Vaping Use   Vaping Use: Never used  Substance and Sexual Activity   Alcohol use: No    Alcohol/week: 0.0 standard drinks   Drug use: No   Sexual activity: Not on file  Other Topics Concern   Not on file  Social History Narrative   Not on file  Social Determinants of Health   Financial Resource Strain: Not on file  Food Insecurity: Not on file  Transportation Needs: Not on file  Physical Activity: Not on file  Stress: Not on file  Social Connections: Not on file  Intimate Partner Violence: Not on file    Review of Systems: See HPI, otherwise negative ROS  Physical Exam: BP 135/79    Pulse 69    Temp 98.2 F (36.8 C) (Temporal)    Resp 18    Ht 5\' 7"  (1.702 m)    Wt 89.4 kg    SpO2 97%    BMI 30.85 kg/m  General:   Alert,  pleasant and cooperative in NAD Head:  Normocephalic and atraumatic. Neck:  Supple; no masses or thyromegaly. Lungs:   Clear throughout to auscultation.    Heart:  Regular rate and rhythm. Abdomen:  Soft, nontender and nondistended. Normal bowel sounds, without guarding, and without rebound.   Neurologic:  Alert and  oriented x4;  grossly normal neurologically.  Impression/Plan: Chad Cardenas is here for an flexible sigmoidoscopy to be performed for h/o crohn's   Risks, benefits, limitations, and alternatives regarding  flexible sigmoidoscopy have been reviewed with the patient.  Questions have been answered.  All parties agreeable.   Sherri Sear, MD  05/03/2021, 9:54 AM

## 2021-05-04 ENCOUNTER — Encounter: Payer: Self-pay | Admitting: Gastroenterology

## 2021-05-10 ENCOUNTER — Encounter: Payer: Self-pay | Admitting: Gastroenterology

## 2021-05-10 LAB — PANCREATIC ELASTASE, FECAL: Pancreatic Elastase, Fecal: >500 ug Elast./g (ref >200)

## 2021-05-11 NOTE — Telephone Encounter (Signed)
Filled out paperwork for new start for

## 2021-05-15 ENCOUNTER — Other Ambulatory Visit: Payer: Self-pay | Admitting: Gastroenterology

## 2021-05-29 NOTE — Telephone Encounter (Signed)
Emailed Sharrie Rothman and she states when they are verifying insurance for [employer] it is saying that patient also has [employer]. Patient needs to call [employer] and tell them that he does not have any coverage with [employer] and they need to take his name off there account so his medication will go through. Called patient and patient wife they agreed they would call [employer] and let me know what they say once they talk to them

## 2021-05-31 ENCOUNTER — Other Ambulatory Visit: Payer: Self-pay

## 2021-05-31 MED ORDER — HUMIRA (2 PEN) 40 MG/0.4ML ~~LOC~~ AJKT: AUTO-INJECTOR | SUBCUTANEOUS | 3 refills | Status: DC

## 2021-05-31 NOTE — Telephone Encounter (Signed)
[facility] about [facility] a [facility] on the Humira We are waiting on approval for [place] so I refilled the medication to get him through till approval of other medication

## 2021-06-11 ENCOUNTER — Telehealth: Payer: Self-pay

## 2021-06-11 NOTE — Telephone Encounter (Signed)
Orson Ape can be initiated 2 weeks after last dose of Humira  RV

## 2021-06-11 NOTE — Telephone Encounter (Signed)
Per Sherrilyn Rist: I just saw this email for [employer].  He is approved just wanted to see how long dr. Allegra Lai would like [place] to wait before skyrizi infusion since he's on humira- email below  LVM for provider to call us back in regards to when treatment should be started. Washout period for Humira is 45 days. Patient's last dose of Humira was on 06/04/2021. Asked provider when they would like patient to start new Skyrizi dosing.

## 2021-06-11 NOTE — Telephone Encounter (Signed)
Informed Sherrilyn Rist By email

## 2021-06-14 ENCOUNTER — Telehealth: Payer: Self-pay

## 2021-06-14 NOTE — Telephone Encounter (Signed)
Chad Cardenas is scheduled for Monday 3/6  for his skyrizi infusion.  ?

## 2021-06-18 ENCOUNTER — Telehealth: Payer: Self-pay

## 2021-06-18 NOTE — Telephone Encounter (Signed)
Per Sharrie Rothman Email:  ?600mg  of Orson Ape was added to 245ml of D5W for a total volume of 251ml then infused through a PIV using the Sapphire pump. The PIV was then discontinued. The patient tolerated the infusion well with no noted adverse reactions. Medical history and medications were correct in CPR+. Next infusion scheduled for 07/16/21. Refill note complete. ? ?

## 2021-07-09 ENCOUNTER — Other Ambulatory Visit: Payer: Self-pay | Admitting: Gastroenterology

## 2021-07-09 DIAGNOSIS — K50112 Crohn's disease of large intestine with intestinal obstruction: Secondary | ICD-10-CM

## 2021-07-09 DIAGNOSIS — Z9049 Acquired absence of other specified parts of digestive tract: Secondary | ICD-10-CM

## 2021-08-02 ENCOUNTER — Ambulatory Visit: Payer: Medicare PPO | Admitting: Gastroenterology

## 2021-08-02 ENCOUNTER — Other Ambulatory Visit: Payer: Self-pay

## 2021-08-02 ENCOUNTER — Encounter: Payer: Self-pay | Admitting: Gastroenterology

## 2021-08-02 VITALS — BP 135/84 | HR 66 | Temp 97.5°F | Ht 67.0 in | Wt 205.0 lb

## 2021-08-02 DIAGNOSIS — K529 Noninfective gastroenteritis and colitis, unspecified: Secondary | ICD-10-CM

## 2021-08-02 DIAGNOSIS — K5010 Crohn's disease of large intestine without complications: Secondary | ICD-10-CM

## 2021-08-02 NOTE — Progress Notes (Signed)
?  ?[name], MD ?[address]  ?[address]  ?[place], [place] [address]  ?Main: [phone]  ?Fax: [phone] ? ? ? ?Gastroenterology Consultation ? ?Referring Provider:     Adin Hector, MD ?Primary Care Physician:  Adin Hector, MD ?Primary Gastroenterologist:  Dr. Vira Agar ?Reason for Consultation: Crohn's disease ?      ? HPI:   ?Chad Cardenas is a 66 y.o. male referred by Dr. Caryl Comes, Tonette Bihari, MD  for consultation & management of Crohn's disease ? ?Patient has history of colonic Crohn's diagnosed in 16s s/p partial colectomy, currently maintained on Humira biweekly, in clinical remission.  Patient switched his care from [facility] clinic gastroenterology to [place].  Patient reports having chronic diarrhea since colectomy. ? ?Crohn's disease classification: ? ?Age: 89 to 31 ?Location:  colonic ?Behavior:  stricturing  ?Perianal: no ? ?IBD diagnosis:1980s  ? ?Disease course: ?He had partial colectomy of the ascending/transverse colon at [facility], then left colectomy 2014 along with ventral hernia repair & lysis of adhesions.  Patient has been maintained on Humira for more than 10 years, biweekly, reports doing well.  He has tolerated Humira well.  He had shingles about 6 months ago.  Currently, experiencing 5-6 nonbloody bowel movements per day and 1 at night since he had partial colectomy. ?No evidence of anemia, normal inflammatory markers, fecal calprotectin level was 48 in 08/2018.  Celiac serologies negative.  Immune to hepatitis A and B.  EGD and colonoscopy 9/21 revealed moderately active colitis, elevated fecal calprotectin levels 183, stool cultures negative for infectious etiology.  Humira trough level 3.8, undetectable antibodies.  Humira reinduction followed by weekly dosing since 9/21.  Humira has been discontinued in January due to persistent inflammation at the anastomosis despite weekly Humira.  Therefore, switched to [address], received first infusion on 06/18/2021,  second infusion on 07/16/2021.  Scheduled for third infusion next week.  Patient did not notice any change in his GI symptoms.  Continues to experience nonbloody diarrhea up to 5 times daily, 1 at night which are manageable.  Denies any fecal incontinence or urgency.  Denies any rectal bleeding.  Weight has been stable.  He is also taking cholestyramine 2 packets daily ? ?Extra intestinal manifestations: None ? ?IBD surgical history: Partial colectomy of the ascending and transverse colon in 1992, left colectomy in 2014 with ventral hernia repair and lysis of adhesions ? ?Imaging: ? ?MRE none ?CTE none ?SBFT none ? ?Procedures: ?Flexible sigmoidoscopy 05/03/2021 ?- Patent functional end-to-end ileo-colonic anastomosis, characterized by friable mucosa and ulceration. ?- The terminal ileum is normal. ?- Mucosal ulceration in the distal rectum. ?- No specimens collected. ? ?Flexible sigmoidoscopy 08/21/2020 ?- Hemorrhoids found on perianal exam. ?- Patent functional end-to-end ileo-colonic anastomosis, characterized by friable mucosa, inflammation and ?ulceration. ?- Normal mucosa in the sigmoid colon. Biopsied. ?- Mucosal ulceration. ? ?DIAGNOSIS:  ?A. NEO-TERMINAL ILEUM; COLD BIOPSY:  ?- ILEAL MUCOSA WITH INTACT VILLI AND MILD NON-SPECIFIC INTRAEPITHELIAL  ?LYMPHOCYTOSIS, SEE COMMENT.  ?- NEGATIVE FOR ACTIVE INFLAMMATION, INFECTIOUS AGENTS, AND GRANULOMAS.  ? ?B. COLON; RANDOM COLD BIOPSY  ?- COLONIC MUCOSA WITH INTACT CRYPT ARCHITECTURE.  ?- NEGATIVE FOR ACTIVE COLITIS, MICROSCOPIC COLITIS, DYSPLASIA, AND  ?MALIGNANCY.  ? ?Endoscopies done 01/07/19. ?EGD: reflux esophagitis. Small hiatal hernia. Mild gastritis. No barretts, dysplasia, malignancy. ?Colonoscopy: ulcerated ileocolonic anastomosis. Ileum otherwise appeared normal as did the rest of the colon. Biopsies of the colon were negative with the exception ofacute inflammation but no dysplasia malignancy  at the ileocolonic anastomosis. 5y repeat recommended. ? ?EGD and  colonoscopy 12/30/2019 ?- Patent functional end-to-end ileo-colonic anastomosis, characterized by inflammation. ?- Aphtha in the entire examined colon. Biopsied. ?- Patchy moderate inflammation was found in the rectum secondary to proctitis. ?- The examined portion of the ileum was normal. ? ?- Normal examined duodenum. ?- Normal stomach. ?- Esophagogastric landmarks identified. ?- Salmon-colored mucosa suspicious for Barrett's esophagus. Biopsied. ?- Normal esophagus. ? ?DIAGNOSIS:  ?A. GASTROESOPHAGEAL JUNCTION,; MUCOSA; COLD BIOPSY:  ?- SQUAMOCOLUMNAR MUCOSA WITH FEATURES OF REFLUX GASTROESOPHAGITIS.  ?- NEGATIVE FOR INTESTINAL METAPLASIA, DYSPLASIA, AND MALIGNANCY.  ? ?B. COLON, LEFT; COLD BIOPSY:  ?- CHRONIC COLITIS WITH MODERATE ACTIVITY (CRYPTITIS, CRYPT ABSCESSES,  ?WITH POSSIBLE SUPERFICIAL ULCERATION).  ?- NEGATIVE FOR GRANULOMA, DYSPLASIA, AND MALIGNANCY.  ? ?VCE none ? ?IBD medications: ? ?Steroids: None ?5-ASA: no mesalamine ?Immunomodulators: AZA, methotrexate none ?TPMT status unknown ?Biologics: ?Anti TNFs: Humira biweekly for more than 15 years, stopped in January 2023 due to persistent inflammation and worsening fecal calprotectin levels ?Anti Integrins: ?Risankizumab: Initiated in 06/2021 ?Tofactinib: ?Clinical trial: ? ? ?Past Medical History:  ?Diagnosis Date  ? Arthritis   ? Crohn's disease ([place])   ? Fatty liver disease, nonalcoholic   ? GERD (gastroesophageal reflux disease)   ? History of kidney stones   ? in the past  ? Hypertension   ? Sleep apnea   ? Use C-PAP  ? ? ?Past Surgical History:  ?Procedure Laterality Date  ? COLON SURGERY  2014  ? Partial Colectomy  ? COLONOSCOPY WITH PROPOFOL N/A 12/30/2019  ? Procedure: COLONOSCOPY WITH BIOPSY;  Surgeon: Lin Landsman, MD;  Location: [facility];  Service: Endoscopy;  Laterality: N/A;  Previous colectomy, extent reached at 0923 ?  ? COLONOSCOPY WITH PROPOFOL N/A 08/21/2020  ? Procedure: COLONOSCOPY WITH PROPOFOL;  Surgeon: Lin Landsman, MD;  Location: [facility] ENDOSCOPY;  Service: Gastroenterology;  Laterality: N/A;  ? ESOPHAGOGASTRODUODENOSCOPY (EGD) WITH PROPOFOL N/A 12/30/2019  ? Procedure: ESOPHAGOGASTRODUODENOSCOPY (EGD) WITH BIOPSY;  Surgeon: Lin Landsman, MD;  Location: [facility];  Service: Endoscopy;  Laterality: N/A;  requests early appt  ? FLEXIBLE SIGMOIDOSCOPY N/A 05/03/2021  ? Procedure: FLEXIBLE SIGMOIDOSCOPY;  Surgeon: Lin Landsman, MD;  Location: [facility];  Service: Endoscopy;  Laterality: N/A;  ? HERNIA REPAIR  2014  ? Ventral Hernia Repair  ? VENTRAL HERNIA REPAIR N/A 07/04/2016  ? Procedure: HERNIA REPAIR VENTRAL ADULT;  Surgeon: Leonie Green, MD;  Location: [facility];  Service: General;  Laterality: N/A;  ? ? ?Current Outpatient Medications:  ?  cholestyramine (QUESTRAN) 4 g packet, MIX 1 PACKET (4 G TOTAL) IN LIQUID OF CHOICE BY MOUTH 2 TIMES DAILY., Disp: 180 packet, Rfl: 1 ?  fenofibrate 160 MG tablet, Take 1 tablet by mouth daily., Disp: , Rfl:  ?  losartan (COZAAR) 50 MG tablet, Take by mouth., Disp: , Rfl:  ?  pantoprazole (PROTONIX) 40 MG tablet, Take 40 mg by mouth daily., Disp: , Rfl:  ?  SKYRIZI 600 MG/10ML injection, Inject into the vein., Disp: , Rfl:  ? ? ?Family History  ?Problem Relation Age of Onset  ? Heart attack Mother   ? Hypertension Father   ?  ? ?Social History  ? ?Tobacco Use  ? Smoking status: Never  ? Smokeless tobacco: Never  ?Vaping Use  ? Vaping Use: Never used  ?Substance Use Topics  ? Alcohol use: No  ?  Alcohol/week: 0.0 standard drinks  ? Drug use: No  ? ? ?  Allergies as of 08/02/2021  ? (No Known Allergies)  ? ? ?Review of Systems:    ?All systems reviewed and negative except where noted in HPI. ? ? Physical Exam:  ?BP 135/84 (BP Location: Left Arm, Patient Position: Sitting, Cuff Size: Normal)   Pulse 66   Temp (!) 97.5 ?F (36.4 ?C) (Oral)   Ht 5\' 7"  (1.702 m)   Wt 205 lb (93 kg)   BMI 32.11 kg/m?  ?No LMP for male patient. ? ?General:    Alert,  Well-developed, well-nourished, pleasant and cooperative in NAD ?Head:  Normocephalic and atraumatic. ?Eyes:  Sclera clear, no icterus.   Conjunctiva pink. ?Ears:  Normal auditory acuity. ?Nose:  No deformit

## 2021-08-03 ENCOUNTER — Telehealth: Payer: Self-pay

## 2021-08-03 LAB — CBC
Hematocrit: 42.6 % (ref 37.5–51.0)
Hemoglobin: 14.7 g/dL (ref 13.0–17.7)
MCH: 33.4 pg — ABNORMAL HIGH (ref 26.6–33.0)
MCHC: 34.5 g/dL (ref 31.5–35.7)
MCV: 97 fL (ref 79–97)
Platelets: 179 10*3/uL (ref 150–450)
RBC: 4.4 x10E6/uL (ref 4.14–5.80)
RDW: 12.9 % (ref 11.6–15.4)
WBC: 8 10*3/uL (ref 3.4–10.8)

## 2021-08-03 LAB — COMPREHENSIVE METABOLIC PANEL
ALT: 45 IU/L — ABNORMAL HIGH (ref 0–44)
AST: 42 IU/L — ABNORMAL HIGH (ref 0–40)
Albumin/Globulin Ratio: 1.4 (ref 1.2–2.2)
Albumin: 4.2 g/dL (ref 3.8–4.8)
Alkaline Phosphatase: 67 IU/L (ref 44–121)
BUN/Creatinine Ratio: 13 (ref 10–24)
BUN: 12 mg/dL (ref 8–27)
Bilirubin Total: 0.6 mg/dL (ref 0.0–1.2)
CO2: 21 mmol/L (ref 20–29)
Calcium: 9.1 mg/dL (ref 8.6–10.2)
Chloride: 105 mmol/L (ref 96–106)
Creatinine, Ser: 0.95 mg/dL (ref 0.76–1.27)
Globulin, Total: 2.9 g/dL (ref 1.5–4.5)
Glucose: 99 mg/dL (ref 70–99)
Potassium: 3.8 mmol/L (ref 3.5–5.2)
Sodium: 140 mmol/L (ref 134–144)
Total Protein: 7.1 g/dL (ref 6.0–8.5)
eGFR: 88 mL/min/{1.73_m2} (ref >59)

## 2021-08-03 NOTE — Telephone Encounter (Signed)
-----   Message from Toney Reil, MD sent at 08/03/2021  9:08 AM EDT ----- ?LFTs are mildly elevated, likely from fatty liver. His last ultrasound was in 2020, recommend to repeat US to assess his fatty liver. Recommend low fat diet, exercise and to lose weight by 10lbs, recheck LFTs in 3 months ? ?RV ?

## 2021-08-03 NOTE — Telephone Encounter (Signed)
Patient verbalized understanding of results. Patient states he is going to talk to his wife about ultrasound scheduling he will call us back on Monday   ?

## 2021-08-06 LAB — CALPROTECTIN, FECAL: Calprotectin, Fecal: 61 ug/g (ref 0–120)

## 2021-08-14 ENCOUNTER — Telehealth: Payer: Self-pay

## 2021-08-14 NOTE — Telephone Encounter (Signed)
Per Chad Cardenas email Patient finished his infusions yesterday they will transfer him to side for the injections  ?

## 2021-08-20 ENCOUNTER — Telehealth: Payer: Self-pay | Admitting: Gastroenterology

## 2021-08-20 NOTE — Telephone Encounter (Signed)
Pharmacy requesting call back. Needs a few things verified in reference to patients prescriptions.   ?

## 2021-08-20 NOTE — Telephone Encounter (Signed)
Called [facility] back and they state that they need a new prescription for the Syrizi injection. Gave the order to [facility] for a year supply.  ?

## 2021-08-23 ENCOUNTER — Telehealth: Payer: Self-pay

## 2021-08-23 ENCOUNTER — Other Ambulatory Visit: Payer: Self-pay

## 2021-08-23 DIAGNOSIS — R7989 Other specified abnormal findings of blood chemistry: Secondary | ICD-10-CM

## 2021-08-23 MED ORDER — SKYRIZI 150 MG/ML ~~LOC~~ SOSY: PREFILLED_SYRINGE | SUBCUTANEOUS | 6 refills | Status: DC

## 2021-08-23 NOTE — Telephone Encounter (Signed)
On 08/02/2021 You said Mr. Nohr ?  ?Your fecal calprotectin levels came back normal.  This is the first time ever since you started biologic therapy that your calprotectin levels came back normal.  This is a good indicator that Cristy Folks is helping. ?  ?We should recheck your liver enzymes in 1 month since they were elevated ?  ?Please let me know if you any questions or concerns ?  ?Rohini Vanga ? ?Do you want a ultrasound now  ?

## 2021-08-23 NOTE — Telephone Encounter (Signed)
Yes ° °RV °

## 2021-08-23 NOTE — Progress Notes (Signed)
[facility] [place] a [facility] because they needed a valid prescription for the Skyrizi 150mg . They got it transfer from [facility]. Sent over a new prescription.  ?

## 2021-08-23 NOTE — Telephone Encounter (Signed)
-----   Message from Lin Landsman, MD sent at 08/23/2021 11:52 AM EDT ----- ?Please remind him about the US liver, it's important to know how his liver is given elevated LFTs ? ?RV ?

## 2021-08-24 NOTE — Addendum Note (Signed)
Addended by: Ulyess Blossom L on: 08/24/2021 09:00 AM ? ? Modules accepted: Orders ? ?

## 2021-08-24 NOTE — Telephone Encounter (Signed)
Order Ultrasound RUQ for patient. Called patient and left a message for call back.  ?Called scheduling and got patient in [place] on 08/30/2021 at 10:15am. Nothing to eat or drink after midnight  ?Sent mychart message to patient  ?

## 2021-08-27 NOTE — Telephone Encounter (Signed)
Called and left a message for call back  

## 2021-08-28 NOTE — Telephone Encounter (Signed)
Patient called back he states he has been out of town and will return on 09/03/2021. Schedule for 09/12/2021 at 9:30am please arrive at 9:00am. Nothing to eat or drink after midnight. Informed patient of this information and he verbalized understanding  ?

## 2021-08-28 NOTE — Telephone Encounter (Signed)
Called and left a message for call back. Cancel the ultrasound for Thursday since I am unable to get in touch with him  ?

## 2021-08-30 ENCOUNTER — Ambulatory Visit: Payer: Medicare PPO

## 2021-09-12 ENCOUNTER — Ambulatory Visit
Admission: RE | Admit: 2021-09-12 | Discharge: 2021-09-12 | Disposition: A | Discharge status: Home / Self Care | Payer: Medicare PPO | Source: Ambulatory Visit | Attending: Gastroenterology | Admitting: Gastroenterology

## 2021-09-12 DIAGNOSIS — R7989 Other specified abnormal findings of blood chemistry: Secondary | ICD-10-CM | POA: Diagnosis present

## 2021-09-14 ENCOUNTER — Telehealth: Payer: Self-pay

## 2021-09-14 DIAGNOSIS — R7989 Other specified abnormal findings of blood chemistry: Secondary | ICD-10-CM

## 2021-09-14 NOTE — Telephone Encounter (Signed)
Patient verbalized understanding and will go for labs in one month

## 2021-09-14 NOTE — Telephone Encounter (Signed)
-----   Message from Toney Reil, MD sent at 09/13/2021 11:30 PM EDT ----- Ultrasound liver is suggestive of fatty liver only.  Recommend recheck LFTs in 1 month  RV

## 2021-09-14 NOTE — Telephone Encounter (Signed)
Order labs.

## 2021-09-26 LAB — LIPID PANEL W/O CHOL/HDL RATIO
Cholesterol, Total: 148 mg/dL (ref 100–199)
HDL: 37 mg/dL — ABNORMAL LOW (ref >39)
LDL Chol Calc (NIH): 75 mg/dL (ref 0–99)
Triglycerides: 219 mg/dL — ABNORMAL HIGH (ref 0–149)
VLDL Cholesterol Cal: 36 mg/dL (ref 5–40)

## 2021-09-26 LAB — CBC WITH DIFFERENTIAL/PLATELET
Basophils Absolute: 0 10*3/uL (ref 0.0–0.2)
Basos: 0 %
EOS (ABSOLUTE): 0.1 10*3/uL (ref 0.0–0.4)
Eos: 1 %
Hematocrit: 46 % (ref 37.5–51.0)
Hemoglobin: 15.5 g/dL (ref 13.0–17.7)
Immature Grans (Abs): 0 10*3/uL (ref 0.0–0.1)
Immature Granulocytes: 1 %
Lymphocytes Absolute: 1.9 10*3/uL (ref 0.7–3.1)
Lymphs: 27 %
MCH: 33.3 pg — ABNORMAL HIGH (ref 26.6–33.0)
MCHC: 33.7 g/dL (ref 31.5–35.7)
MCV: 99 fL — ABNORMAL HIGH (ref 79–97)
Monocytes Absolute: 0.8 10*3/uL (ref 0.1–0.9)
Monocytes: 11 %
Neutrophils Absolute: 4.4 10*3/uL (ref 1.4–7.0)
Neutrophils: 60 %
Platelets: 181 10*3/uL (ref 150–450)
RBC: 4.66 x10E6/uL (ref 4.14–5.80)
RDW: 12.8 % (ref 11.6–15.4)
WBC: 7.2 10*3/uL (ref 3.4–10.8)

## 2021-09-26 LAB — PSA TOTAL (REFLEX TO FREE): Prostate Specific Ag, Serum: 0.4 ng/mL (ref 0.0–4.0)

## 2021-09-26 LAB — MICROSCOPIC EXAMINATION
Bacteria, UA: NONE SEEN
Casts: NONE SEEN /lpf
Epithelial Cells (non renal): NONE SEEN /hpf (ref 0–10)
RBC, Urine: NONE SEEN /hpf (ref 0–2)
WBC, UA: NONE SEEN /hpf (ref 0–5)

## 2021-09-26 LAB — COMPREHENSIVE METABOLIC PANEL
ALT: 34 IU/L (ref 0–44)
AST: 36 IU/L (ref 0–40)
Albumin/Globulin Ratio: 1.3 (ref 1.2–2.2)
Albumin: 4.3 g/dL (ref 3.8–4.8)
Alkaline Phosphatase: 106 IU/L (ref 44–121)
BUN/Creatinine Ratio: 21 (ref 10–24)
BUN: 17 mg/dL (ref 8–27)
Bilirubin Total: 0.5 mg/dL (ref 0.0–1.2)
CO2: 20 mmol/L (ref 20–29)
Calcium: 9.6 mg/dL (ref 8.6–10.2)
Chloride: 102 mmol/L (ref 96–106)
Creatinine, Ser: 0.8 mg/dL (ref 0.76–1.27)
Globulin, Total: 3.2 g/dL (ref 1.5–4.5)
Glucose: 89 mg/dL (ref 70–99)
Potassium: 4.1 mmol/L (ref 3.5–5.2)
Sodium: 137 mmol/L (ref 134–144)
Total Protein: 7.5 g/dL (ref 6.0–8.5)
eGFR: 98 mL/min/{1.73_m2} (ref >59)

## 2021-09-26 LAB — VITAMIN D 25 HYDROXY (VIT D DEFICIENCY, FRACTURES): Vit D, 25-Hydroxy: 34.9 ng/mL (ref 30.0–100.0)

## 2021-09-26 LAB — URINALYSIS, COMPLETE
Bilirubin, UA: NEGATIVE
Glucose, UA: NEGATIVE
Ketones, UA: NEGATIVE
Leukocytes,UA: NEGATIVE
Nitrite, UA: NEGATIVE
Protein,UA: NEGATIVE
RBC, UA: NEGATIVE
Specific Gravity, UA: 1.008 (ref 1.005–1.030)
Urobilinogen, Ur: 0.2 mg/dL (ref 0.2–1.0)
pH, UA: 6 (ref 5.0–7.5)

## 2021-09-26 LAB — MICROALBUMIN / CREATININE URINE RATIO
Creatinine, Urine: 33.1 mg/dL
Microalb/Creat Ratio: <9 mg/g creat (ref 0–29)
Microalbumin, Urine: <3 ug/mL

## 2021-09-26 LAB — HEPATIC FUNCTION PANEL: Bilirubin, Direct: 0.13 mg/dL (ref 0.00–0.40)

## 2021-09-26 LAB — TSH: TSH: 2.28 u[IU]/mL (ref 0.450–4.500)

## 2021-10-22 ENCOUNTER — Other Ambulatory Visit: Payer: Self-pay | Admitting: Gastroenterology

## 2021-11-05 ENCOUNTER — Telehealth: Payer: Self-pay

## 2021-11-05 NOTE — Telephone Encounter (Signed)
Faxed patient assistance for Chad Cardenas to [place] for patient faxed to [phone]

## 2021-11-13 NOTE — Telephone Encounter (Signed)
Patient did not get approved for patient assistance for the [facility] patient assistance. Patient has a 0 dollar deductible, out of pocket maximum is 2,500 and co insurance copay Is 100. Informed patient by [employer]

## 2021-12-25 ENCOUNTER — Other Ambulatory Visit: Payer: Self-pay

## 2021-12-25 ENCOUNTER — Encounter: Payer: Self-pay | Admitting: Gastroenterology

## 2021-12-25 ENCOUNTER — Ambulatory Visit (INDEPENDENT_AMBULATORY_CARE_PROVIDER_SITE_OTHER): Payer: Medicare PPO | Admitting: Gastroenterology

## 2021-12-25 VITALS — BP 118/80 | HR 90 | Temp 98.0°F | Ht 67.0 in | Wt 205.5 lb

## 2021-12-25 DIAGNOSIS — K5010 Crohn's disease of large intestine without complications: Secondary | ICD-10-CM

## 2021-12-25 NOTE — Progress Notes (Signed)
Chad Darby, MD [address]  [place]  [place], [place] [address]  Main: [phone]  Fax: [phone]    Gastroenterology Consultation  Referring Provider:     Adin Hector, MD Primary Care Physician:  Adin Hector, MD Primary Gastroenterologist:  Dr. Vira Agar Reason for Consultation: Crohn's disease        HPI:   VASILIOS OTTAWAY is a 66 y.o. male referred by Dr. Caryl Comes, Wendelyn Breslow III, MD  for consultation & management of Crohn's disease  Patient has history of colonic Crohn's diagnosed in 56s s/p partial colectomy, currently maintained on Humira biweekly, in clinical remission.  Patient switched his care from [facility] clinic gastroenterology to [place].  Patient reports having chronic diarrhea since colectomy.  Crohn's disease classification:  Age: 4 to 73 Location:  colonic Behavior:  stricturing  Perianal: no  IBD diagnosis:1980s   Disease course: He had partial colectomy of the ascending/transverse colon at [facility], then left colectomy 2014 along with ventral hernia repair & lysis of adhesions.  Patient has been maintained on Humira for more than 10 years, biweekly, reports doing well.  He has tolerated Humira well.  He had shingles about 6 months ago.  Currently, experiencing 5-6 nonbloody bowel movements per day and 1 at night since he had partial colectomy. No evidence of anemia, normal inflammatory markers, fecal calprotectin level was 48 in 08/2018.  Celiac serologies negative.  Immune to hepatitis A and B.  EGD and colonoscopy 9/21 revealed moderately active colitis, elevated fecal calprotectin levels 183, stool cultures negative for infectious etiology.  Humira trough level 3.8, undetectable antibodies.  Humira reinduction followed by weekly dosing since 9/21.  Humira has been discontinued in January due to persistent inflammation at the anastomosis despite weekly Humira.  Therefore, switched to [employer] in 06/2021.  Currently on maintenance  dose every 8 weeks, however patient continues to experience nonbloody diarrhea up to 5 times daily, 1 at night which are manageable.  Denies any fecal incontinence or urgency.  Denies any rectal bleeding.  Weight has been stable.  He is also taking cholestyramine 2 packets daily.  His fecal calprotectin levels were normal as of 07/2021 at 61.   Extra intestinal manifestations: None  IBD surgical history: Partial colectomy of the ascending and transverse colon in 1992, left colectomy in 2014 with ventral hernia repair and lysis of adhesions  Imaging:  MRE none CTE none SBFT none  Procedures: Flexible sigmoidoscopy 05/03/2021 - Patent functional end-to-end ileo-colonic anastomosis, characterized by friable mucosa and ulceration. - The terminal ileum is normal. - Mucosal ulceration in the distal rectum. - No specimens collected.  Flexible sigmoidoscopy 08/21/2020 - Hemorrhoids found on perianal exam. - Patent functional end-to-end ileo-colonic anastomosis, characterized by friable mucosa, inflammation and ulceration. - Normal mucosa in the sigmoid colon. Biopsied. - Mucosal ulceration.  DIAGNOSIS:  A. NEO-TERMINAL ILEUM; COLD BIOPSY:  - ILEAL MUCOSA WITH INTACT VILLI AND MILD NON-SPECIFIC INTRAEPITHELIAL  LYMPHOCYTOSIS, SEE COMMENT.  - NEGATIVE FOR ACTIVE INFLAMMATION, INFECTIOUS AGENTS, AND GRANULOMAS.   B. COLON; RANDOM COLD BIOPSY  - COLONIC MUCOSA WITH INTACT CRYPT ARCHITECTURE.  - NEGATIVE FOR ACTIVE COLITIS, MICROSCOPIC COLITIS, DYSPLASIA, AND  MALIGNANCY.   Endoscopies done 01/07/19. EGD: reflux esophagitis. Small hiatal hernia. Mild gastritis. No barretts, dysplasia, malignancy. Colonoscopy: ulcerated ileocolonic anastomosis. Ileum otherwise appeared normal as did the rest of the colon. Biopsies of the colon were negative with the exception ofacute inflammation but no dysplasia malignancy at the ileocolonic  anastomosis. 5y repeat recommended.  EGD and colonoscopy 12/30/2019 -  Patent functional end-to-end ileo-colonic anastomosis, characterized by inflammation. - Aphtha in the entire examined colon. Biopsied. - Patchy moderate inflammation was found in the rectum secondary to proctitis. - The examined portion of the ileum was normal.  - Normal examined duodenum. - Normal stomach. - Esophagogastric landmarks identified. - Salmon-colored mucosa suspicious for Barrett's esophagus. Biopsied. - Normal esophagus.  DIAGNOSIS:  A. GASTROESOPHAGEAL JUNCTION,; MUCOSA; COLD BIOPSY:  - SQUAMOCOLUMNAR MUCOSA WITH FEATURES OF REFLUX GASTROESOPHAGITIS.  - NEGATIVE FOR INTESTINAL METAPLASIA, DYSPLASIA, AND MALIGNANCY.   B. COLON, LEFT; COLD BIOPSY:  - CHRONIC COLITIS WITH MODERATE ACTIVITY (CRYPTITIS, CRYPT ABSCESSES,  WITH POSSIBLE SUPERFICIAL ULCERATION).  - NEGATIVE FOR GRANULOMA, DYSPLASIA, AND MALIGNANCY.   VCE none  IBD medications:  Steroids: None 5-ASA: no mesalamine Immunomodulators: AZA, methotrexate none TPMT status unknown Biologics: Anti TNFs: Humira biweekly for more than 15 years, stopped in January 2023 due to persistent inflammation and worsening fecal calprotectin levels Anti Integrins: Risankizumab: Initiated in 06/2021 Tofactinib: Clinical trial:   Past Medical History:  Diagnosis Date   Arthritis    Crohn's disease ([place])    Fatty liver disease, nonalcoholic    GERD (gastroesophageal reflux disease)    History of kidney stones    in the past   Hypertension    Sleep apnea    Use C-PAP    Past Surgical History:  Procedure Laterality Date   COLON SURGERY  2014   Partial Colectomy   COLONOSCOPY WITH PROPOFOL N/A 12/30/2019   Procedure: COLONOSCOPY WITH BIOPSY;  Surgeon: Lin Landsman, MD;  Location: [place];  Service: Endoscopy;  Laterality: N/A;  Previous colectomy, extent reached at 0923    COLONOSCOPY WITH PROPOFOL N/A 08/21/2020   Procedure: COLONOSCOPY WITH PROPOFOL;  Surgeon: Lin Landsman, MD;  [facility] ENDOSCOPY;  Service: Gastroenterology;  Laterality: N/A;   ESOPHAGOGASTRODUODENOSCOPY (EGD) WITH PROPOFOL N/A 12/30/2019   Procedure: ESOPHAGOGASTRODUODENOSCOPY (EGD) WITH BIOPSY;  Surgeon: Lin Landsman, MD;  Location: [place];  Service: Endoscopy;  Laterality: N/A;  requests early appt   FLEXIBLE SIGMOIDOSCOPY N/A 05/03/2021   Procedure: FLEXIBLE SIGMOIDOSCOPY;  Surgeon: Lin Landsman, MD;  Location: [place];  Service: Endoscopy;  Laterality: N/A;   HERNIA REPAIR  2014   Ventral Hernia Repair   VENTRAL HERNIA REPAIR N/A 07/04/2016   Procedure: HERNIA REPAIR VENTRAL ADULT;  Surgeon: Leonie Green, MD;  Location: [facility];  Service: General;  Laterality: N/A;    Current Outpatient Medications:    cholestyramine (QUESTRAN) 4 g packet, MIX 1 PACKET (4 G TOTAL) IN LIQUID OF CHOICE BY MOUTH 2 TIMES DAILY., Disp: 180 packet, Rfl: 1   fenofibrate 160 MG tablet, Take 1 tablet by mouth daily., Disp: , Rfl:    losartan (COZAAR) 50 MG tablet, Take by mouth., Disp: , Rfl:    pantoprazole (PROTONIX) 40 MG tablet, Take 1 tablet by mouth 2 (two) times daily., Disp: , Rfl:    Risankizumab-rzaa (SKYRIZI PEN) 150 MG/ML SOAJ, INJECT 1 PEN SQ UNDER THE SKIN EVERY 8 WEEKS, Disp: 2.4 mL, Rfl: 6   Family History  Problem Relation Age of Onset   Heart attack Mother    Hypertension Father      Social History   Tobacco Use   Smoking status: Never   Smokeless tobacco: Never  Vaping Use   Vaping Use: Never used  Substance Use Topics   Alcohol use: No    Alcohol/week:  0.0 standard drinks of alcohol   Drug use: No    Allergies as of 12/25/2021   (No Known Allergies)    Review of Systems:    All systems reviewed and negative except where noted in HPI.   Physical Exam:  BP 118/80 (BP Location: Left Arm, Patient Position: Sitting, Cuff Size: Normal)   Pulse 90   Temp 98 F (36.7 C) (Oral)   Ht 5' 7"  (1.702 m)   Wt 205 lb 8 oz (93.2 kg)   BMI 32.19 kg/m   No LMP for male patient.  General:   Alert,  Well-developed, well-nourished, pleasant and cooperative in NAD Head:  Normocephalic and atraumatic. Eyes:  Sclera clear, no icterus.   Conjunctiva pink. Ears:  Normal auditory acuity. Nose:  No deformity, discharge, or lesions. Mouth:  No deformity or lesions,oropharynx pink & moist. Neck:  Supple; no masses or thyromegaly. Lungs:  Respirations even and unlabored.  Clear throughout to auscultation.   No wheezes, crackles, or rhonchi. No acute distress. Heart:  Regular rate and rhythm; no murmurs, clicks, rubs, or gallops. Abdomen:  Normal bowel sounds. Soft, vertical scar in midline, well-healed, non-tender and non-distended without masses, hepatosplenomegaly or hernias noted.  No guarding or rebound tenderness.   Rectal: Not performed Msk:  Symmetrical without gross deformities. Good, equal movement & strength bilaterally. Pulses:  Normal pulses noted. Extremities:  No clubbing or edema.  No cyanosis. Neurologic:  Alert and oriented x3;  grossly normal neurologically. Skin:  Intact without significant lesions or rashes. No jaundice. Psych:  Alert and cooperative. Normal mood and affect.  Imaging Studies: Reviewed  Assessment and Plan:   KAYLER RISE is a 66 y.o. male with history of hypertension, chronic GERD, Crohn's colitis diagnosed in 81s s/p partial colectomy in 1992 and 2014, with moderately active colitis based on colonoscopy 9/21, subtherapeutic Humira levels, elevated fecal calprotectin levels, s/p Humira reinduction and increased maintenance to weekly  Crohn's colitis, s/p ileocolonic anastomosis: not In clinical and histologic remission Patient developed secondary loss of response to weekly Humira Switched to [employer] in 06/2021, received 3 induction doses, currently on maintenance shot every 8 weeks Patient always had elevated fecal calprotectin levels,.  Normalized to 61 on Skyrizi as of April 2023., recheck levels today,  recheck CBC and LFTs today Recommend flexible sigmoidoscopy to assess endoscopy response to [employer] Continue cholestyramine to 2 times daily for chronic diarrhea which is likely secondary to subtotal colectomy  Chronic GERD EGD unremarkable for Barrett's esophagus Continue Protonix 40 mg once daily before meals  IBD Health Maintenance  1.TB status: QuantiFERON gold negative as of 06/01/2020 2. Anemia: Not present 3.Immunizations: Hep A and B immune, recommend annual influenza vaccine, patient received prevnar at [facility], given him the prescription for pneumovax.  Patient had shingles 1 year ago.  He received first dose of Shingrix vaccine, received second dose in 06/2020 4.Cancer screening I) Colon cancer/dysplasia surveillance: Last colonoscopy 12/2019, repeat colonoscopy in 1/23 II) Skin cancer - counseled about annual skin exam by dermatology and skin protection in summer using sun screen SPF > 50, clothing 5.Bone health Vitamin D status: normal  Bone density testing: n/a 5. Labs: Every 3-4 months 6. Smoking: n/a 7. NSAIDs and Antibiotics use: n/a  Elevated LFTs, most likely secondary to fatty liver with recent weight gain Ultrasound liver with elastography in 6/20 revealed F0-F1 Reiterated on low-fat diet, regular exercise LFTs are currently normal   Follow up in 6 months   Chad Darby, MD

## 2021-12-26 LAB — HEPATIC FUNCTION PANEL
ALT: 40 IU/L (ref 0–44)
AST: 39 IU/L (ref 0–40)
Albumin: 4.2 g/dL (ref 3.9–4.9)
Alkaline Phosphatase: 83 IU/L (ref 44–121)
Bilirubin Total: 0.5 mg/dL (ref 0.0–1.2)
Bilirubin, Direct: 0.17 mg/dL (ref 0.00–0.40)
Total Protein: 7.6 g/dL (ref 6.0–8.5)

## 2021-12-26 LAB — CBC
Hematocrit: 44.9 % (ref 37.5–51.0)
Hemoglobin: 15.8 g/dL (ref 13.0–17.7)
MCH: 33.2 pg — ABNORMAL HIGH (ref 26.6–33.0)
MCHC: 35.2 g/dL (ref 31.5–35.7)
MCV: 94 fL (ref 79–97)
Platelets: 188 10*3/uL (ref 150–450)
RBC: 4.76 x10E6/uL (ref 4.14–5.80)
RDW: 13.1 % (ref 11.6–15.4)
WBC: 7.3 10*3/uL (ref 3.4–10.8)

## 2021-12-30 LAB — CALPROTECTIN, FECAL: Calprotectin, Fecal: 118 ug/g (ref 0–120)

## 2021-12-31 ENCOUNTER — Telehealth: Payer: Self-pay

## 2021-12-31 NOTE — Telephone Encounter (Signed)
Called patient and left a message for call back. Sent mychart message

## 2021-12-31 NOTE — Telephone Encounter (Signed)
-----   Message from Lin Landsman, MD sent at 12/31/2021 12:51 PM EDT ----- Please inform patient that the blood work results as well as the fecal calprotectin levels came back normal.  I will see him for flexible sigmoidoscopy  RV

## 2022-01-15 ENCOUNTER — Ambulatory Visit: Payer: Medicare PPO | Admitting: Anesthesiology

## 2022-01-15 ENCOUNTER — Encounter
Admission: RE | Disposition: A | Discharge status: Home / Self Care | Payer: Self-pay | Source: Home / Self Care | Attending: Gastroenterology

## 2022-01-15 ENCOUNTER — Encounter: Payer: Self-pay | Admitting: Gastroenterology

## 2022-01-15 ENCOUNTER — Ambulatory Visit
Admission: RE | Admit: 2022-01-15 | Discharge: 2022-01-15 | Disposition: A | Discharge status: Home / Self Care | Payer: Medicare PPO | Attending: Gastroenterology | Admitting: Gastroenterology

## 2022-01-15 DIAGNOSIS — K5080 Crohn's disease of both small and large intestine without complications: Secondary | ICD-10-CM | POA: Diagnosis present

## 2022-01-15 DIAGNOSIS — Z980 Intestinal bypass and anastomosis status: Secondary | ICD-10-CM | POA: Diagnosis not present

## 2022-01-15 DIAGNOSIS — K219 Gastro-esophageal reflux disease without esophagitis: Secondary | ICD-10-CM | POA: Diagnosis not present

## 2022-01-15 DIAGNOSIS — K5010 Crohn's disease of large intestine without complications: Secondary | ICD-10-CM

## 2022-01-15 DIAGNOSIS — G4730 Sleep apnea, unspecified: Secondary | ICD-10-CM | POA: Insufficient documentation

## 2022-01-15 DIAGNOSIS — I10 Essential (primary) hypertension: Secondary | ICD-10-CM | POA: Insufficient documentation

## 2022-01-15 HISTORY — PX: FLEXIBLE SIGMOIDOSCOPY: SHX5431

## 2022-01-15 SURGERY — SIGMOIDOSCOPY, FLEXIBLE
Anesthesia: General

## 2022-01-15 MED ORDER — SODIUM CHLORIDE 0.9 % IV SOLN: INTRAVENOUS | Status: DC

## 2022-01-15 NOTE — Op Note (Signed)
[facility] Gastroenterology Patient Name: Chad Cardenas Procedure Date: 01/15/2022 1:05 PM MRN: [id number] Account #: [id number] Date of Birth: October 22, 1955 Admit Type: Outpatient Age: 66 Room: [facility] ENDO ROOM 3 Gender: Male Note Status: Finalized Instrument Name: Upper Endoscope [id number] Procedure:             Flexible Sigmoidoscopy Indications:           Disease activity assessment of Crohn's disease of the                         small bowel and colon, Assess therapeutic response to                         therapy of Crohn's disease of the small bowel and colon Providers:             Lin Landsman MD, MD Referring MD:          Ramonita Lab, MD (Referring MD) Medicines:             None Complications:         No immediate complications. Estimated blood loss: None. Procedure:             Pre-Anesthesia Assessment:                        - Prior to the procedure, a History and Physical was                         performed, and patient medications and allergies were                         reviewed. The patient is competent. The risks and                         benefits of the procedure and the sedation options and                         risks were discussed with the patient. All questions                         were answered and informed consent was obtained.                         Patient identification and proposed procedure were                         verified by the physician, the nurse, the                         anesthesiologist, the anesthetist and the technician                         in the pre-procedure area in the procedure room in the                         endoscopy suite. Mental Status Examination: alert and                         oriented. Airway Examination: normal oropharyngeal  airway and neck mobility. Respiratory Examination:                         clear to auscultation. CV Examination: normal.                          Prophylactic Antibiotics: The patient does not require                         prophylactic antibiotics. Prior Anticoagulants: The                         patient has taken no previous anticoagulant or                         antiplatelet agents. ASA Grade Assessment: III - A                         patient with severe systemic disease. After reviewing                         the risks and benefits, the patient was deemed in                         satisfactory condition to undergo the procedure. The                         anesthesia plan was to use general anesthesia.                         Immediately prior to administration of medications,                         the patient was re-assessed for adequacy to receive                         sedatives. The heart rate, respiratory rate, oxygen                         saturations, blood pressure, adequacy of pulmonary                         ventilation, and response to care were monitored                         throughout the procedure. The physical status of the                         patient was re-assessed after the procedure.                        After obtaining informed consent, the scope was passed                         under direct vision. The Endoscope was introduced                         through the anus and advanced to the the ileo-sigmoid  anastomosis. The flexible sigmoidoscopy was                         accomplished without difficulty. The patient tolerated                         the procedure well. The quality of the bowel                         preparation was adequate. Findings:      The perianal and digital rectal examinations were normal. Pertinent       negatives include normal sphincter tone and no palpable rectal lesions.      There was evidence of a prior end-to-side ileo-colonic anastomosis in       the sigmoid colon. This was patent and was characterized by        inflammation. The anastomosis was traversed.      The neo terminal ileum appeared normal. Biopsies were taken with a cold       forceps for histology. Estimated blood loss: none.      The rectum, recto-sigmoid colon and sigmoid colon appeared normal.       Biopsies were taken with a cold forceps for histology. Impression:            - Patent end-to-side ileo-colonic anastomosis,                         characterized by inflammation.                        - The terminal ileum is normal. Biopsied.                        - The rectum, recto-sigmoid colon and sigmoid colon                         are normal. Biopsied. Recommendation:        - Discharge patient to home (with escort).                        - Resume previous diet today.                        - Await pathology results.                        - Continue present medications. Procedure Code(s):     --- Professional ---                        [phone], Sigmoidoscopy, flexible; with biopsy, single or                         multiple Diagnosis Code(s):     --- Professional ---                        Z98.0, Intestinal bypass and anastomosis status                        K50.80, Crohn's disease of both small and large  intestine without complications CPT copyright 2019 American Medical Association. All rights reserved. The codes documented in this report are preliminary and upon coder review may  be revised to meet current compliance requirements. Dr. Ulyess Mort Lin Landsman MD, MD 01/15/2022 1:26:57 PM This report has been signed electronically. Number of Addenda: 0 Note Initiated On: 01/15/2022 1:05 PM Total Procedure Duration: 0 hours 8 minutes 28 seconds  Estimated Blood Loss:  Estimated blood loss: none.      [facility]

## 2022-01-15 NOTE — Addendum Note (Signed)
Addendum  created 01/15/22 1313 by Alaia Lordi, Precious Haws, MD   Intraprocedure Event edited, Intraprocedure Staff edited

## 2022-01-15 NOTE — Anesthesia Preprocedure Evaluation (Signed)
Anesthesia Evaluation  Patient identified by MRN, date of birth, ID band Patient awake    Reviewed: Allergy & Precautions, NPO status , Patient's Chart, lab work & pertinent test results  History of Anesthesia Complications Negative for: history of anesthetic complications  Airway Mallampati: III  TM Distance: <3 FB Neck ROM: full    Dental  (+) Chipped   Pulmonary sleep apnea ,    Pulmonary exam normal        Cardiovascular Exercise Tolerance: Good hypertension, (-) anginaNormal cardiovascular exam     Neuro/Psych negative neurological ROS  negative psych ROS   GI/Hepatic Neg liver ROS, GERD  Controlled,  Endo/Other  negative endocrine ROS  Renal/GU negative Renal ROS  negative genitourinary   Musculoskeletal   Abdominal   Peds  Hematology negative hematology ROS (+)   Anesthesia Other Findings Past Medical History: No date: Arthritis No date: Crohn's disease ([facility]) No date: Fatty liver disease, nonalcoholic No date: GERD (gastroesophageal reflux disease) No date: History of kidney stones     Comment:  in the past No date: Hypertension No date: Sleep apnea     Comment:  Use C-PAP  Past Surgical History: 2014: COLON SURGERY     Comment:  Partial Colectomy 12/30/2019: COLONOSCOPY WITH PROPOFOL; N/A     Comment:  Procedure: COLONOSCOPY WITH BIOPSY;  Surgeon: Lin Landsman, MD;  Location: [place];                Service: Endoscopy;  Laterality: N/A;  Previous               colectomy, extent reached at 0923  08/21/2020: COLONOSCOPY WITH PROPOFOL; N/A     Comment:  Procedure: COLONOSCOPY WITH PROPOFOL;  Surgeon: Lin Landsman, MD;  Location: [place];  Service:               Gastroenterology;  Laterality: N/A; 12/30/2019: ESOPHAGOGASTRODUODENOSCOPY (EGD) WITH PROPOFOL; N/A     Comment:  Procedure: ESOPHAGOGASTRODUODENOSCOPY (EGD) WITH BIOPSY;               Surgeon: Lin Landsman, MD;  Location: [place];  Service: Endoscopy;  Laterality: N/A;                requests early appt 05/03/2021: FLEXIBLE SIGMOIDOSCOPY; N/A     Comment:  Procedure: FLEXIBLE SIGMOIDOSCOPY;  Surgeon: Lin Landsman, MD;  Location: [place];                Service: Endoscopy;  Laterality: N/A; 2014: HERNIA REPAIR     Comment:  Ventral Hernia Repair 07/04/2016: VENTRAL HERNIA REPAIR; N/A     Comment:  Procedure: HERNIA REPAIR VENTRAL ADULT;  Surgeon: Leonie Green, MD;  Location: [facility];  Service: General;              Laterality: N/A;  BMI    Body Mass Index: 30.41 kg/m      Reproductive/Obstetrics negative OB ROS  Anesthesia Physical Anesthesia Plan  ASA: 3  Anesthesia Plan: General   Post-op Pain Management:    Induction: Intravenous  PONV Risk Score and Plan: Propofol infusion and TIVA  Airway Management Planned: Natural Airway and Nasal Cannula  Additional Equipment:   Intra-op Plan:   Post-operative Plan:   Informed Consent: I have reviewed the patients History and Physical, chart, labs and discussed the procedure including the risks, benefits and alternatives for the proposed anesthesia with the patient or authorized representative who has indicated his/her understanding and acceptance.     Dental Advisory Given  Plan Discussed with: Anesthesiologist, CRNA and Surgeon  Anesthesia Plan Comments: (Patient consented for risks of anesthesia including but not limited to:  - adverse reactions to medications - risk of airway placement if required - damage to eyes, teeth, lips or other oral mucosa - nerve damage due to positioning  - sore throat or hoarseness - Damage to heart, brain, nerves, lungs, other parts of body or loss of life  Patient voiced understanding.)        Anesthesia Quick  Evaluation

## 2022-01-15 NOTE — H&P (Signed)
[name], MD [address]  [place]  [place], [place] [address]  Main: [phone]  Fax: [phone] Pager: [phone]  Primary Care Physician:  Adin Hector, MD Primary Gastroenterologist:  Dr. [name]  Pre-Procedure History & Physical: HPI:  Chad Cardenas is a 66 y.o. male is here for an flexible sigmoidoscopy.   Past Medical History:  Diagnosis Date   Arthritis    Crohn's disease ([place])    Fatty liver disease, nonalcoholic    GERD (gastroesophageal reflux disease)    History of kidney stones    in the past   Hypertension    Sleep apnea    Use C-PAP    Past Surgical History:  Procedure Laterality Date   COLON SURGERY  2014   Partial Colectomy   COLONOSCOPY WITH PROPOFOL N/A 12/30/2019   Procedure: COLONOSCOPY WITH BIOPSY;  Surgeon: Lin Landsman, MD;  Location: [place];  Service: Endoscopy;  Laterality: N/A;  Previous colectomy, extent reached at 0923    COLONOSCOPY WITH PROPOFOL N/A 08/21/2020   Procedure: COLONOSCOPY WITH PROPOFOL;  Surgeon: Lin Landsman, MD;  Location: [facility] ENDOSCOPY;  Service: Gastroenterology;  Laterality: N/A;   ESOPHAGOGASTRODUODENOSCOPY (EGD) WITH PROPOFOL N/A 12/30/2019   Procedure: ESOPHAGOGASTRODUODENOSCOPY (EGD) WITH BIOPSY;  Surgeon: Lin Landsman, MD;  Location: [place];  Service: Endoscopy;  Laterality: N/A;  requests early appt   FLEXIBLE SIGMOIDOSCOPY N/A 05/03/2021   Procedure: FLEXIBLE SIGMOIDOSCOPY;  Surgeon: Lin Landsman, MD;  Location: [place];  Service: Endoscopy;  Laterality: N/A;   HERNIA REPAIR  2014   Ventral Hernia Repair   VENTRAL HERNIA REPAIR N/A 07/04/2016   Procedure: HERNIA REPAIR VENTRAL ADULT;  Surgeon: Leonie Green, MD;  Location: [facility];  Service: General;  Laterality: N/A;    Prior to Admission medications   Medication Sig Start Date End Date Taking? Authorizing Provider  losartan (COZAAR) 50 MG tablet Take by mouth.  05/18/19 01/15/22 Yes [provider]  pantoprazole (PROTONIX) 40 MG tablet Take 1 tablet by mouth 2 (two) times daily. 09/21/21  Yes [provider]  cholestyramine (QUESTRAN) 4 g packet MIX 1 PACKET (4 G TOTAL) IN LIQUID OF CHOICE BY MOUTH 2 TIMES DAILY. 07/09/21   Lin Landsman, MD  fenofibrate 160 MG tablet Take 1 tablet by mouth daily. 04/26/21   [provider]  Risankizumab-rzaa (SKYRIZI PEN) 150 MG/ML SOAJ INJECT 1 PEN SQ UNDER THE SKIN EVERY 8 WEEKS 10/22/21   Lin Landsman, MD    Allergies as of 12/25/2021   (No Known Allergies)    Family History  Problem Relation Age of Onset   Heart attack Mother    Hypertension Father     Social History   Socioeconomic History   Marital status: Married    Spouse name: Not on file   Number of children: Not on file   Years of education: Not on file   Highest education level: Not on file  Occupational History   Not on file  Tobacco Use   Smoking status: Never   Smokeless tobacco: Never  Vaping Use   Vaping Use: Never used  Substance and Sexual Activity   Alcohol use: No    Alcohol/week: 0.0 standard drinks of alcohol   Drug use: No   Sexual activity: Not on file  Other Topics Concern   Not on file  Social History Narrative   Not on file   Social Determinants of Health  Financial Resource Strain: Not on file  Food Insecurity: Not on file  Transportation Needs: Not on file  Physical Activity: Not on file  Stress: Not on file  Social Connections: Not on file  Intimate Partner Violence: Not on file    Review of Systems: See HPI, otherwise negative ROS  Physical Exam: BP (!) 174/100   Pulse (!) 58   Resp 18   Ht 5' 8"  (1.727 m)   Wt 90.7 kg   SpO2 98%   BMI 30.41 kg/m  General:   Alert,  pleasant and cooperative in NAD Head:  Normocephalic and atraumatic. Neck:  Supple; no masses or thyromegaly. Lungs:  Clear throughout to auscultation.    Heart:  Regular rate and  rhythm. Abdomen:  Soft, nontender and nondistended. Normal bowel sounds, without guarding, and without rebound.   Neurologic:  Alert and  oriented x4;  grossly normal neurologically.  Impression/Plan: Chad Cardenas is here for an flexible sigmoidoscopy to be performed for h/o crohn's disease  Risks, benefits, limitations, and alternatives regarding  flexible sigmoidoscopy have been reviewed with the patient.  Questions have been answered.  All parties agreeable.   Sherri Sear, MD  01/15/2022, 12:06 PM

## 2022-01-16 ENCOUNTER — Encounter: Payer: Self-pay | Admitting: Gastroenterology

## 2022-01-16 LAB — SURGICAL PATHOLOGY

## 2022-01-17 ENCOUNTER — Telehealth: Payer: Self-pay

## 2022-01-17 NOTE — Telephone Encounter (Signed)
-----   Message from Lin Landsman, MD sent at 01/16/2022  5:00 PM EDT ----- Please inform patient that the pathology results from sigmoidoscopy shows that his colitis is inactive.  There is no active Crohn's disease in the small intestine as well.  This correlates with his normal fecal calprotectin levels.  Continue Skyrizi  [employer]

## 2022-01-17 NOTE — Telephone Encounter (Signed)
Informed patient and he verbalized understanding of results

## 2022-03-23 ENCOUNTER — Other Ambulatory Visit: Payer: Self-pay | Admitting: Gastroenterology

## 2022-03-23 DIAGNOSIS — Z9049 Acquired absence of other specified parts of digestive tract: Secondary | ICD-10-CM

## 2022-03-23 DIAGNOSIS — K50112 Crohn's disease of large intestine with intestinal obstruction: Secondary | ICD-10-CM

## 2022-04-22 ENCOUNTER — Telehealth: Payer: Self-pay

## 2022-04-22 NOTE — Telephone Encounter (Signed)
Submitted PA through cover my meds for Skyrizi 150mg  every 8 weeks.

## 2022-09-18 ENCOUNTER — Other Ambulatory Visit: Payer: Self-pay | Admitting: Gastroenterology

## 2022-09-18 NOTE — Telephone Encounter (Signed)
Called and left a message for call back to scheduled appointment with Dr. Allegra Lai

## 2022-09-23 ENCOUNTER — Telehealth: Payer: Self-pay

## 2022-09-23 MED ORDER — SKYRIZI PEN 150 MG/ML ~~LOC~~ SOAJ: SUBCUTANEOUS | 3 refills | Status: DC

## 2022-09-23 NOTE — Telephone Encounter (Signed)
Patient made appointment for 09 sent medication to the pharmacy

## 2022-09-23 NOTE — Telephone Encounter (Signed)
Pt left a voicemail that he was returning your call to schedule his follow up appointment with Dr. Allegra Lai in order to continue to receive his medication.

## 2022-10-30 ENCOUNTER — Encounter: Payer: Self-pay | Admitting: Gastroenterology

## 2022-10-30 NOTE — Telephone Encounter (Signed)
Patient has always got the 150mg  does refilled

## 2022-11-04 MED ORDER — SKYRIZI 360 MG/2.4ML ~~LOC~~ SOCT: 1.0000 | SUBCUTANEOUS | 5 refills | Status: AC

## 2022-12-18 ENCOUNTER — Other Ambulatory Visit: Payer: Self-pay

## 2022-12-24 ENCOUNTER — Ambulatory Visit: Payer: Medicare PPO | Admitting: Gastroenterology

## 2022-12-24 ENCOUNTER — Encounter: Payer: Self-pay | Admitting: Gastroenterology

## 2022-12-24 VITALS — BP 196/133 | HR 80 | Temp 97.5°F | Ht 67.0 in | Wt 229.0 lb

## 2022-12-24 DIAGNOSIS — K219 Gastro-esophageal reflux disease without esophagitis: Secondary | ICD-10-CM | POA: Diagnosis not present

## 2022-12-24 DIAGNOSIS — K7581 Nonalcoholic steatohepatitis (NASH): Secondary | ICD-10-CM

## 2022-12-24 DIAGNOSIS — R7989 Other specified abnormal findings of blood chemistry: Secondary | ICD-10-CM | POA: Diagnosis not present

## 2022-12-24 DIAGNOSIS — K5010 Crohn's disease of large intestine without complications: Secondary | ICD-10-CM | POA: Diagnosis not present

## 2022-12-24 DIAGNOSIS — K760 Fatty (change of) liver, not elsewhere classified: Secondary | ICD-10-CM

## 2022-12-24 NOTE — Progress Notes (Unsigned)
Chad Repress, MD [address]  [address]  [place], [place] [address]  Main: [phone]  Fax: [phone]    Gastroenterology Consultation  Referring Provider:     Lynnea Ferrier, MD Primary Care Physician:  Chad Ferrier, MD Primary Gastroenterologist:  Dr. Mechele Collin Reason for Consultation: Crohn's disease        HPI:   Chad Cardenas is a 67 y.o. male referred by Dr. Graciela Husbands, Chad Borders III, MD  for consultation & management of Crohn's disease  Patient has history of colonic Crohn's diagnosed in 1980s s/p partial colectomy, currently maintained on Humira biweekly, in clinical remission.  Patient switched his care from [facility] clinic gastroenterology to [facility].  Patient reports having chronic diarrhea since colectomy.  Crohn's disease classification:  Age: 89 to 69 Location:  colonic Behavior:  stricturing  Perianal: no  IBD diagnosis:1980s   Disease course: He had partial colectomy of the ascending/transverse colon at [facility], then left colectomy 2014 along with ventral hernia repair & lysis of adhesions.  Patient has been maintained on Humira for more than 10 years, biweekly, reports doing well.  He has tolerated Humira well.  He had shingles about 6 months ago.  Currently, experiencing 5-6 nonbloody bowel movements per day and 1 at night since he had partial colectomy. No evidence of anemia, normal inflammatory markers, fecal calprotectin level was 48 in 08/2018.  Celiac serologies negative.  Immune to hepatitis A and B.  EGD and colonoscopy 9/21 revealed moderately active colitis, elevated fecal calprotectin levels 183, stool cultures negative for infectious etiology.  Humira trough level 3.8, undetectable antibodies.  Humira reinduction followed by weekly dosing since 9/21.  Humira has been discontinued in January due to persistent inflammation at the anastomosis despite weekly Humira.  Therefore, switched to [employer] in 06/2021.  Currently on maintenance  dose every 8 weeks, however patient continues to experience nonbloody diarrhea up to 5 times daily, 1 at night which are manageable.  Denies any fecal incontinence or urgency.  Denies any rectal bleeding.  Weight has been stable.  He is also taking cholestyramine 2 packets daily.  His fecal calprotectin levels were normal as of 07/2021 at 61.   Extra intestinal manifestations: None  IBD surgical history: Partial colectomy of the ascending and transverse colon in 1992, left colectomy in 2014 with ventral hernia repair and lysis of adhesions  Imaging:  MRE none CTE none SBFT none  Procedures: DIAGNOSIS:  A. NEOTERMINAL ILEUM; COLD BIOPSY:  - UNREMARKABLE SMALL INTESTINAL MUCOSA.  - NEGATIVE FOR DYSPLASIA AND MALIGNANCY.   B.  COLON, SIGMOID; COLD BIOPSY:  - PATCHY CHRONIC INACTIVE COLITIS.  - NEGATIVE FOR GRANULOMAS, DYSPLASIA, AND MALIGNANCY.  Flexible sigmoidoscopy 05/03/2021 - Patent functional end-to-end ileo-colonic anastomosis, characterized by friable mucosa and ulceration. - The terminal ileum is normal. - Mucosal ulceration in the distal rectum. - No specimens collected.  Flexible sigmoidoscopy 08/21/2020 - Hemorrhoids found on perianal exam. - Patent functional end-to-end ileo-colonic anastomosis, characterized by friable mucosa, inflammation and ulceration. - Normal mucosa in the sigmoid colon. Biopsied. - Mucosal ulceration.  DIAGNOSIS:  A. NEO-TERMINAL ILEUM; COLD BIOPSY:  - ILEAL MUCOSA WITH INTACT VILLI AND MILD NON-SPECIFIC INTRAEPITHELIAL  LYMPHOCYTOSIS, SEE COMMENT.  - NEGATIVE FOR ACTIVE INFLAMMATION, INFECTIOUS AGENTS, AND GRANULOMAS.   B. COLON; RANDOM COLD BIOPSY  - COLONIC MUCOSA WITH INTACT CRYPT ARCHITECTURE.  - NEGATIVE FOR ACTIVE COLITIS, MICROSCOPIC COLITIS, DYSPLASIA, AND  MALIGNANCY.   Endoscopies done 01/07/19. EGD: reflux  esophagitis. Small hiatal hernia. Mild gastritis. No barretts, dysplasia, malignancy. Colonoscopy: ulcerated ileocolonic  anastomosis. Ileum otherwise appeared normal as did the rest of the colon. Biopsies of the colon were negative with the exception ofacute inflammation but no dysplasia malignancy at the ileocolonic anastomosis. 5y repeat recommended.  EGD and colonoscopy 12/30/2019 - Patent functional end-to-end ileo-colonic anastomosis, characterized by inflammation. - Aphtha in the entire examined colon. Biopsied. - Patchy moderate inflammation was found in the rectum secondary to proctitis. - The examined portion of the ileum was normal.  - Normal examined duodenum. - Normal stomach. - Esophagogastric landmarks identified. - Salmon-colored mucosa suspicious for Barrett's esophagus. Biopsied. - Normal esophagus.  DIAGNOSIS:  A. GASTROESOPHAGEAL JUNCTION,; MUCOSA; COLD BIOPSY:  - SQUAMOCOLUMNAR MUCOSA WITH FEATURES OF REFLUX GASTROESOPHAGITIS.  - NEGATIVE FOR INTESTINAL METAPLASIA, DYSPLASIA, AND MALIGNANCY.   B. COLON, LEFT; COLD BIOPSY:  - CHRONIC COLITIS WITH MODERATE ACTIVITY (CRYPTITIS, CRYPT ABSCESSES,  WITH POSSIBLE SUPERFICIAL ULCERATION).  - NEGATIVE FOR GRANULOMA, DYSPLASIA, AND MALIGNANCY.   VCE none  IBD medications:  Steroids: None 5-ASA: no mesalamine Immunomodulators: AZA, methotrexate none TPMT status unknown Biologics: Anti TNFs: Humira biweekly for more than 15 years, stopped in January 2023 due to persistent inflammation and worsening fecal calprotectin levels Anti Integrins: Risankizumab: Initiated in 06/2021 Tofactinib: Clinical trial:   Past Medical History:  Diagnosis Date   Arthritis    Crohn's disease ([facility])    Fatty liver disease, nonalcoholic    GERD (gastroesophageal reflux disease)    History of kidney stones    in the past   Hypertension    Sleep apnea    Use C-PAP    Past Surgical History:  Procedure Laterality Date   COLON SURGERY  2014   Partial Colectomy   COLONOSCOPY WITH PROPOFOL N/A 12/30/2019   Procedure: COLONOSCOPY WITH BIOPSY;  Surgeon:  Chad Reil, MD;  [facility];  Service: Endoscopy;  Laterality: N/A;  Previous colectomy, extent reached at 0923    COLONOSCOPY WITH PROPOFOL N/A 08/21/2020   Procedure: COLONOSCOPY WITH PROPOFOL;  Surgeon: Chad Reil, MD;  Location[facility];  Service: Gastroenterology;  Laterality: N/A;   ESOPHAGOGASTRODUODENOSCOPY (EGD) WITH PROPOFOL N/A 12/30/2019   Procedure: ESOPHAGOGASTRODUODENOSCOPY (EGD) WITH BIOPSY;  Surgeon: Chad Reil, MD;  Location: [facility];  Service: Endoscopy;  Laterality: N/A;  requests early appt   FLEXIBLE SIGMOIDOSCOPY N/A 05/03/2021   Procedure: FLEXIBLE SIGMOIDOSCOPY;  Surgeon: Chad Reil, MD;  Location: [facility];  Service: Endoscopy;  Laterality: N/A;   FLEXIBLE SIGMOIDOSCOPY N/A 01/15/2022   Procedure: FLEXIBLE SIGMOIDOSCOPY;  Surgeon: Chad Reil, MD;  Location: [facility] ENDOSCOPY;  Service: Gastroenterology;  Laterality: N/A;   HERNIA REPAIR  2014   Ventral Hernia Repair   VENTRAL HERNIA REPAIR N/A 07/04/2016   Procedure: HERNIA REPAIR VENTRAL ADULT;  Surgeon: Nadeen Landau, MD;  Location: [facility];  Service: General;  Laterality: N/A;    Current Outpatient Medications:    cholestyramine (QUESTRAN) 4 g packet, MIX 1 PACKET (4 G TOTAL) IN LIQUID OF CHOICE BY MOUTH 2 TIMES DAILY., Disp: 180 packet, Rfl: 1   fenofibrate 160 MG tablet, Take 1 tablet by mouth daily., Disp: , Rfl:    losartan (COZAAR) 50 MG tablet, Take by mouth., Disp: , Rfl:    pantoprazole (PROTONIX) 40 MG tablet, Take 1 tablet by mouth 2 (two) times daily., Disp: , Rfl:    Risankizumab-rzaa (SKYRIZI) 360 MG/2.4ML SOCT, Inject 1 Pen into the skin every 8 (eight)  weeks., Disp: 2.4 mL, Rfl: 5   ketoconazole (NIZORAL) 2 % shampoo, Apply 1 Application topically once. (Patient not taking: Reported on 12/24/2022), Disp: , Rfl:    nystatin cream (MYCOSTATIN), Apply 1 Application topically 2 (two) times daily. (Patient not  taking: Reported on 12/24/2022), Disp: , Rfl:    Family History  Problem Relation Age of Onset   Heart attack Mother    Hypertension Father      Social History   Tobacco Use   Smoking status: Never   Smokeless tobacco: Never  Vaping Use   Vaping status: Never Used  Substance Use Topics   Alcohol use: No    Alcohol/week: 0.0 standard drinks of alcohol   Drug use: No    Allergies as of 12/24/2022   (No Known Allergies)    Review of Systems:    All systems reviewed and negative except where noted in HPI.   Physical Exam:  BP (!) 196/133 (BP Location: Right Arm, Patient Position: Sitting, Cuff Size: Normal)   Pulse 80   Temp (!) 97.5 F (36.4 C) (Oral)   Ht 5\' 7"  (1.702 m)   Wt 229 lb (103.9 kg)   BMI 35.87 kg/m  No LMP for male patient.  General:   Alert,  Well-developed, well-nourished, pleasant and cooperative in NAD Head:  Normocephalic and atraumatic. Eyes:  Sclera clear, no icterus.   Conjunctiva pink. Ears:  Normal auditory acuity. Nose:  No deformity, discharge, or lesions. Mouth:  No deformity or lesions,oropharynx pink & moist. Neck:  Supple; no masses or thyromegaly. Lungs:  Respirations even and unlabored.  Clear throughout to auscultation.   No wheezes, crackles, or rhonchi. No acute distress. Heart:  Regular rate and rhythm; no murmurs, clicks, rubs, or gallops. Abdomen:  Normal bowel sounds. Soft, vertical scar in midline, well-healed, non-tender and non-distended without masses, hepatosplenomegaly or hernias noted.  No guarding or rebound tenderness.   Rectal: Not performed Msk:  Symmetrical without gross deformities. Good, equal movement & strength bilaterally. Pulses:  Normal pulses noted. Extremities:  No clubbing or edema.  No cyanosis. Neurologic:  Alert and oriented x3;  grossly normal neurologically. Skin:  Intact without significant lesions or rashes. No jaundice. Psych:  Alert and cooperative. Normal mood and affect.  Imaging  Studies: Reviewed  Assessment and Plan:   CARLIN EZE is a 67 y.o. male with history of hypertension, chronic GERD, Crohn's colitis diagnosed in 83s s/p partial colectomy in 1992 and 2014, with moderately active colitis based on colonoscopy 9/21, subtherapeutic Humira levels, elevated fecal calprotectin levels, s/p Humira reinduction and increased maintenance to weekly  Crohn's colitis, s/p ileocolonic anastomosis: not In clinical and histologic remission Patient developed secondary loss of response to weekly Humira Switched to [place] in 06/2021, received 3 induction doses, currently on maintenance shot every 8 weeks Patient always had elevated fecal calprotectin levels,.  Normalized to 61 on Skyrizi as of April 2023., recheck levels today, recheck CBC and LFTs today Recommend flexible sigmoidoscopy to assess endoscopy response to [employer] Continue cholestyramine to 2 times daily for chronic diarrhea which is likely secondary to subtotal colectomy  Chronic GERD EGD unremarkable for Barrett's esophagus Continue Protonix 40 mg once daily before meals  IBD Health Maintenance  1.TB status: QuantiFERON gold negative as of 06/01/2020 2. Anemia: Not present 3.Immunizations: Hep A and B immune, recommend annual influenza vaccine, patient [facility], given him the prescription for pneumovax.  Patient had shingles 1 year ago.  He received first  dose of Shingrix vaccine, received second dose in 06/2020 4.Cancer screening I) Colon cancer/dysplasia surveillance: Last colonoscopy 12/2019, repeat colonoscopy in 1/23 II) Skin cancer - counseled about annual skin exam by dermatology and skin protection in summer using sun screen SPF > 50, clothing 5.Bone health Vitamin D status: normal  Bone density testing: n/a 5. Labs: Every 3-4 months 6. Smoking: n/a 7. NSAIDs and Antibiotics use: n/a  Elevated LFTs, most likely secondary to fatty liver with recent weight gain Ultrasound  liver with elastography in 6/20 revealed F0-F1 Reiterated on low-fat diet, regular exercise LFTs are currently normal    Follow up in 6 months   Chad Repress, MD

## 2022-12-24 NOTE — Patient Instructions (Signed)
Low-Sodium Eating Plan Salt (sodium) helps you keep a healthy balance of fluids in your body. Too much sodium can raise your blood pressure. It can also cause fluid and waste to be held in your body. Your health care provider or dietitian may recommend a low-sodium eating plan if you have high blood pressure (hypertension), kidney disease, liver disease, or heart failure. Eating less sodium can help lower your blood pressure and reduce swelling. It can also protect your heart, liver, and kidneys. What are tips for following this plan? Reading food labels  Check food labels for the amount of sodium per serving. If you eat more than one serving, you must multiply the listed amount by the number of servings. Choose foods with less than 140 milligrams (mg) of sodium per serving. Avoid foods with 300 mg of sodium or more per serving. Always check how much sodium is in a product, even if the label says "unsalted" or "no salt added." Shopping  Buy products labeled as "low-sodium" or "no salt added." Buy fresh foods. Avoid canned foods and pre-made or frozen meals. Avoid canned, cured, or processed meats. Buy breads that have less than 80 mg of sodium per slice. Cooking  Eat more home-cooked food. Try to eat less restaurant, buffet, and fast food. Try not to add salt when you cook. Use salt-free seasonings or herbs instead of table salt or sea salt. Check with your provider or pharmacist before using salt substitutes. Cook with plant-based oils, such as canola, sunflower, or olive oil. Meal planning When eating at a restaurant, ask if your food can be made with less salt or no salt. Avoid dishes labeled as brined, pickled, cured, or smoked. Avoid dishes made with soy sauce, miso, or teriyaki sauce. Avoid foods that have monosodium glutamate (MSG) in them. MSG may be added to some restaurant food, sauces, soups, bouillon, and canned foods. Make meals that can be grilled, baked, poached, roasted, or  steamed. These are often made with less sodium. General information Try to limit your sodium intake to 1,500-2,300 mg each day, or the amount told by your provider. What foods should I eat? Fruits Fresh, frozen, or canned fruit. Fruit juice. Vegetables Fresh or frozen vegetables. "No salt added" canned vegetables. "No salt added" tomato sauce and paste. Low-sodium or reduced-sodium tomato and vegetable juice. Grains Low-sodium cereals, such as oats, puffed wheat and rice, and shredded wheat. Low-sodium crackers. Unsalted rice. Unsalted pasta. Low-sodium bread. Whole grain breads and whole grain pasta. Meats and other proteins Fresh or frozen meat, poultry, seafood, and fish. These should have no added salt. Low-sodium canned tuna and salmon. Unsalted nuts. Dried peas, beans, and lentils without added salt. Unsalted canned beans. Eggs. Unsalted nut butters. Dairy Milk. Soy milk. Cheese that is naturally low in sodium, such as ricotta cheese, fresh mozzarella, or Swiss cheese. Low-sodium or reduced-sodium cheese. Cream cheese. Yogurt. Seasonings and condiments Fresh and dried herbs and spices. Salt-free seasonings. Low-sodium mustard and ketchup. Sodium-free salad dressing. Sodium-free light mayonnaise. Fresh or refrigerated horseradish. Lemon juice. Vinegar. Other foods Homemade, reduced-sodium, or low-sodium soups. Unsalted popcorn and pretzels. Low-salt or salt-free chips. The items listed above may not be all the foods and drinks you can have. Talk to a dietitian to learn more. What foods should I avoid? Vegetables Sauerkraut, pickled vegetables, and relishes. Olives. [place] fries. Onion rings. Regular canned vegetables, except low-sodium or reduced-sodium items. Regular canned tomato sauce and paste. Regular tomato and vegetable juice. Frozen vegetables in sauces. Grains Instant  hot cereals. Bread stuffing, pancake, and biscuit mixes. Croutons. Seasoned rice or pasta mixes. Noodle soup  cups. Boxed or frozen macaroni and cheese. Regular salted crackers. Self-rising flour. Meats and other proteins Meat or fish that is salted, canned, smoked, spiced, or pickled. Precooked or cured meat, such as sausages or meat loaves. Tomasa Blase. Ham. Pepperoni. Hot dogs. Corned beef. Chipped beef. Salt pork. Jerky. Pickled herring, anchovies, and sardines. Regular canned tuna. Salted nuts. Dairy Processed cheese and cheese spreads. Hard cheeses. Cheese curds. Blue cheese. Feta cheese. String cheese. Regular cottage cheese. Buttermilk. Canned milk. Fats and oils Salted butter. Regular margarine. Ghee. Bacon fat. Seasonings and condiments Onion salt, garlic salt, seasoned salt, table salt, and sea salt. Canned and packaged gravies. Worcestershire sauce. Tartar sauce. Barbecue sauce. Teriyaki sauce. Soy sauce, including reduced-sodium soy sauce. Steak sauce. Fish sauce. Oyster sauce. Cocktail sauce. Horseradish that you find on the shelf. Regular ketchup and mustard. Meat flavorings and tenderizers. Bouillon cubes. Hot sauce. Pre-made or packaged marinades. Pre-made or packaged taco seasonings. Relishes. Regular salad dressings. Salsa. Other foods Salted popcorn and pretzels. Corn chips and puffs. Potato and tortilla chips. Canned or dried soups. Pizza. Frozen entrees and pot pies. The items listed above may not be all the foods and drinks you should avoid. Talk to a dietitian to learn more. This information is not intended to replace advice given to you by your health care provider. Make sure you discuss any questions you have with your health care provider. Document Revised: 04/18/2022 Document Reviewed: 04/18/2022 Elsevier Patient Education  2024 [employer].

## 2022-12-26 LAB — NASH FIBROSURE(R) PLUS
ALPHA 2-MACROGLOBULINS, QN: 385 mg/dL — ABNORMAL HIGH (ref 110–276)
ALT (SGPT) P5P: 109 IU/L — ABNORMAL HIGH (ref 0–55)
AST (SGOT) P5P: 92 IU/L — ABNORMAL HIGH (ref 0–40)
Apolipoprotein A-1: 154 mg/dL (ref 101–178)
Bilirubin, Total: 0.6 mg/dL (ref 0.0–1.2)
Cholesterol, Total: 199 mg/dL (ref 100–199)
Fibrosis Score: 0.74 — ABNORMAL HIGH (ref 0.00–0.21)
GGT: 34 IU/L (ref 0–65)
Glucose: 89 mg/dL (ref 70–99)
Haptoglobin: 61 mg/dL (ref 32–363)
NASH Score: 0.94 — ABNORMAL HIGH (ref 0.00–0.25)
Steatosis Score: 0.45 — ABNORMAL HIGH (ref 0.00–0.40)
Triglycerides: 228 mg/dL — ABNORMAL HIGH (ref 0–149)

## 2022-12-27 ENCOUNTER — Encounter: Payer: Self-pay | Admitting: Gastroenterology

## 2022-12-29 LAB — CALPROTECTIN, FECAL: Calprotectin, Fecal: 259 ug/g — ABNORMAL HIGH (ref 0–120)

## 2022-12-30 ENCOUNTER — Telehealth: Payer: Self-pay

## 2022-12-30 NOTE — Telephone Encounter (Signed)
-----   Message from [facility] sent at 12/30/2022  3:53 PM EDT ----- Chad Cardenas  Please inform patient that his stool test, fecal calprotectin levels came back elevated which indicates that he has active colitis.  Part of his inflammation probably is also driven by his weight gain.  Please go over MyChart message that I sent to him regarding NASH score and reiterate about losing weight within next 6 months  Rohini Vanga

## 2022-12-30 NOTE — Telephone Encounter (Signed)
Patient verbalized understanding of results. He states he will try to lose weight and watch his diet. He asked if I could send him something to his mychart for diet so I did.

## 2023-02-03 ENCOUNTER — Encounter: Payer: Self-pay | Admitting: Gastroenterology

## 2023-02-12 ENCOUNTER — Other Ambulatory Visit: Payer: Self-pay | Admitting: Gastroenterology

## 2023-02-12 DIAGNOSIS — Z9049 Acquired absence of other specified parts of digestive tract: Secondary | ICD-10-CM

## 2023-02-12 DIAGNOSIS — K50112 Crohn's disease of large intestine with intestinal obstruction: Secondary | ICD-10-CM

## 2023-04-18 ENCOUNTER — Telehealth: Payer: Self-pay

## 2023-04-18 NOTE — Telephone Encounter (Signed)
 Submitted PA through cover my meds for patient Skyrizi. Waiting on response from insurance company

## 2023-04-28 ENCOUNTER — Telehealth: Payer: Self-pay

## 2023-04-28 NOTE — Telephone Encounter (Signed)
 Aram Beecham from [facility] has not been able to get in touch with the patient and they have put his prescription on hold till the patient calls them back.

## 2023-08-05 ENCOUNTER — Ambulatory Visit: Payer: Medicare PPO | Admitting: Gastroenterology

## 2023-08-21 ENCOUNTER — Other Ambulatory Visit: Payer: Self-pay

## 2023-08-25 ENCOUNTER — Encounter: Payer: Self-pay | Admitting: Gastroenterology

## 2023-08-25 ENCOUNTER — Ambulatory Visit: Admitting: Gastroenterology

## 2023-08-25 VITALS — BP 134/86 | HR 80 | Temp 98.1°F | Ht 67.0 in | Wt 219.0 lb

## 2023-08-25 DIAGNOSIS — K7581 Nonalcoholic steatohepatitis (NASH): Secondary | ICD-10-CM | POA: Diagnosis not present

## 2023-08-25 DIAGNOSIS — K219 Gastro-esophageal reflux disease without esophagitis: Secondary | ICD-10-CM

## 2023-08-25 DIAGNOSIS — K5010 Crohn's disease of large intestine without complications: Secondary | ICD-10-CM | POA: Diagnosis not present

## 2023-08-25 DIAGNOSIS — R7989 Other specified abnormal findings of blood chemistry: Secondary | ICD-10-CM | POA: Diagnosis not present

## 2023-08-25 DIAGNOSIS — R635 Abnormal weight gain: Secondary | ICD-10-CM

## 2023-08-25 NOTE — Patient Instructions (Signed)
 You will need to call [phone] to schedule the ultrasound at your convenience.

## 2023-08-25 NOTE — Progress Notes (Signed)
 Karma Oz, MD [address]  [address]  [place], [place] [address]  Main: [phone]  Fax: [phone]    Gastroenterology Consultation  Referring Provider:     Melchor Spoon, MD Primary Care Physician:  Melchor Spoon, MD Primary Gastroenterologist:  Dr. Felicita Horns Reason for Consultation: Crohn's disease        HPI:   Chad Cardenas is a 68 y.o. male referred by Dr. Rodolfo Clan, Hayden Lipoma, MD  for consultation & management of Crohn's disease  History of present illness  Chad Cardenas is here for follow-up of Crohn's disease.  Chad Cardenas is compliant with Skyrizi  every 8 weeks.  His diarrhea is fairly under control.  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  Chad Cardenas states that since Chad Cardenas is retired, Chad Cardenas has not been physically active, not moving around much, has no control over food which led to significant amount of weight.  Chad Cardenas states that bread is his weakness, Chad Cardenas is fond of it so much.  Chad Cardenas does not have any other GI concerns today.  His most recent transaminases are mildly elevated Also, his blood pressure has been significantly elevated.  Chad Cardenas also states that his blood pressure monitor at home does not show very high numbers.  Apparently Chad Cardenas has been taking cholestyramine  along with other medications.  Chad Cardenas reports that Chad Cardenas did take losartan today Chad Cardenas denies any chest pain.  Chad Cardenas does report exertional dyspnea since his weight gain.  Chad Cardenas has history of colonic Crohn's diagnosed in 83s s/p partial colectomy, currently maintained on Humira  biweekly, in clinical remission.  Chad Cardenas switched his care from [facility] clinic gastroenterology to [facility].  Chad Cardenas reports having chronic diarrhea since colectomy.  Crohn's disease classification:  Age: 58 to 21 Location:  colonic Behavior:  stricturing  Perianal: no  IBD diagnosis:1980s   Disease course: Chad Cardenas had partial colectomy of the ascending/transverse colon  at [facility], then left colectomy 2014 along with ventral hernia repair & lysis of adhesions.  Chad Cardenas has been maintained on Humira  for more than 10 years, biweekly, reports doing well.  Chad Cardenas has tolerated Humira  well.  Chad Cardenas had shingles about 6 months ago.  Currently, experiencing 5-6 nonbloody bowel movements per day and 1 at night since Chad Cardenas had partial colectomy. No evidence of anemia, normal inflammatory markers, fecal calprotectin level was 48 in 08/2018.  Celiac serologies negative.  Immune to hepatitis A and B.  EGD and colonoscopy 9/21 revealed moderately active colitis, elevated fecal calprotectin levels 183, stool cultures negative for infectious etiology.  Humira  trough level 3.8, undetectable antibodies.  Humira  reinduction followed by weekly dosing since 9/21.  Humira  has been discontinued in January due to persistent inflammation at the anastomosis despite weekly Humira .  Therefore, switched to Skyrizi  in 06/2021.  Currently on maintenance dose every 8 weeks, however Chad Cardenas continues to experience nonbloody diarrhea up to 5 times daily, 1 at night which are manageable.  Denies any fecal incontinence or urgency.  Denies any rectal bleeding.  Weight has been stable.  Chad Cardenas is also taking cholestyramine  2 packets daily.  His fecal calprotectin levels were normal as of 07/2021 at 61.   Extra intestinal manifestations: None  IBD surgical history: Partial colectomy of the ascending and transverse colon in 1992, left colectomy in 2014 with ventral hernia repair and lysis of adhesions  Imaging:  MRE none CTE none SBFT none  Procedures: Flexible  sigmoidoscopy 01/15/2022 DIAGNOSIS:  A. NEOTERMINAL ILEUM; COLD BIOPSY:  - UNREMARKABLE SMALL INTESTINAL MUCOSA.  - NEGATIVE FOR DYSPLASIA AND MALIGNANCY.   B.  COLON, SIGMOID; COLD BIOPSY:  - PATCHY CHRONIC INACTIVE COLITIS.  - NEGATIVE FOR GRANULOMAS, DYSPLASIA, AND MALIGNANCY.   Flexible sigmoidoscopy 05/03/2021 - Patent functional end-to-end  ileo-colonic anastomosis, characterized by friable mucosa and ulceration. - The terminal ileum is normal. - Mucosal ulceration in the distal rectum. - No specimens collected.  Flexible sigmoidoscopy 08/21/2020 - Hemorrhoids found on perianal exam. - Patent functional end-to-end ileo-colonic anastomosis, characterized by friable mucosa, inflammation and ulceration. - Normal mucosa in the sigmoid colon. Biopsied. - Mucosal ulceration.  DIAGNOSIS:  A. NEO-TERMINAL ILEUM; COLD BIOPSY:  - ILEAL MUCOSA WITH INTACT VILLI AND MILD NON-SPECIFIC INTRAEPITHELIAL  LYMPHOCYTOSIS, SEE COMMENT.  - NEGATIVE FOR ACTIVE INFLAMMATION, INFECTIOUS AGENTS, AND GRANULOMAS.   B. COLON; RANDOM COLD BIOPSY  - COLONIC MUCOSA WITH INTACT CRYPT ARCHITECTURE.  - NEGATIVE FOR ACTIVE COLITIS, MICROSCOPIC COLITIS, DYSPLASIA, AND  MALIGNANCY.   Endoscopies done 01/07/19. EGD: reflux esophagitis. Small hiatal hernia. Mild gastritis. No barretts, dysplasia, malignancy. Colonoscopy: ulcerated ileocolonic anastomosis. Ileum otherwise appeared normal as did the rest of the colon. Biopsies of the colon were negative with the exception ofacute inflammation but no dysplasia malignancy at the ileocolonic anastomosis. 5y repeat recommended.  EGD and colonoscopy 12/30/2019 - Patent functional end-to-end ileo-colonic anastomosis, characterized by inflammation. - Aphtha in the entire examined colon. Biopsied. - Patchy moderate inflammation was found in the rectum secondary to proctitis. - The examined portion of the ileum was normal.  - Normal examined duodenum. - Normal stomach. - Esophagogastric landmarks identified. - Salmon-colored mucosa suspicious for Barrett's esophagus. Biopsied. - Normal esophagus.  DIAGNOSIS:  A. GASTROESOPHAGEAL JUNCTION,; MUCOSA; COLD BIOPSY:  - SQUAMOCOLUMNAR MUCOSA WITH FEATURES OF REFLUX GASTROESOPHAGITIS.  - NEGATIVE FOR INTESTINAL METAPLASIA, DYSPLASIA, AND MALIGNANCY.   B. COLON, LEFT;  COLD BIOPSY:  - CHRONIC COLITIS WITH MODERATE ACTIVITY (CRYPTITIS, CRYPT ABSCESSES,  WITH POSSIBLE SUPERFICIAL ULCERATION).  - NEGATIVE FOR GRANULOMA, DYSPLASIA, AND MALIGNANCY.   VCE none  IBD medications:  Steroids: None 5-ASA: no mesalamine Immunomodulators: AZA, methotrexate none TPMT status unknown Biologics: Anti TNFs: Humira  biweekly for more than 15 years, stopped in January 2023 due to persistent inflammation and worsening fecal calprotectin levels Anti Integrins: Risankizumab : Initiated in 06/2021 Tofactinib: Clinical trial:   Past Medical History:  Diagnosis Date   Arthritis    Crohn's disease (HCC)    Fatty liver disease, nonalcoholic    GERD (gastroesophageal reflux disease)    History of kidney stones    in the past   Hypertension    Sleep apnea    Use C-PAP    Past Surgical History:  Procedure Laterality Date   COLON SURGERY  2014   Partial Colectomy   COLONOSCOPY WITH PROPOFOL  N/A 12/30/2019   Procedure: COLONOSCOPY WITH BIOPSY;  Surgeon: Selena Daily, MD;  Location: [facility];  Service: Endoscopy;  Laterality: N/A;  Previous colectomy, extent reached at 0923    COLONOSCOPY WITH PROPOFOL  N/A 08/21/2020   Procedure: COLONOSCOPY WITH PROPOFOL ;  Surgeon: Selena Daily, MD;  Location: [facility];  Service: Gastroenterology;  Laterality: N/A;   ESOPHAGOGASTRODUODENOSCOPY (EGD) WITH PROPOFOL  N/A 12/30/2019   Procedure: ESOPHAGOGASTRODUODENOSCOPY (EGD) WITH BIOPSY;  Surgeon: Selena Daily, MD;  Location: [facility];  Service: Endoscopy;  Laterality: N/A;  requests early appt   FLEXIBLE SIGMOIDOSCOPY N/A 05/03/2021   Procedure: FLEXIBLE SIGMOIDOSCOPY;  Surgeon: Selena Daily, MD;  Location: [facility];  Service: Endoscopy;  Laterality: N/A;   FLEXIBLE SIGMOIDOSCOPY N/A 01/15/2022   Procedure: FLEXIBLE SIGMOIDOSCOPY;  Surgeon: Selena Daily, MD;  Location: [facility] END[facility];  Service: Gastroenterology;   Laterality: N/A;   HERNIA REPAIR  2014   Ventral Hernia Repair   VENTRAL HERNIA REPAIR N/A 07/04/2016   Procedure: HERNIA REPAIR VENTRAL ADULT;  Surgeon: Benancio Bracket, MD;  Location: [facility];  Service: General;  Laterality: N/A;    Current Outpatient Medications:    cholestyramine  (QUESTRAN ) 4 g packet, MIX 1 PACKET (4 G TOTAL) IN LIQUID OF CHOICE BY MOUTH 2 TIMES DAILY., Disp: 180 packet, Rfl: 1   fenofibrate 160 MG tablet, Take 1 tablet by mouth daily., Disp: , Rfl:    losartan (COZAAR) 100 MG tablet, Take 100 mg by mouth daily., Disp: , Rfl:    pantoprazole (PROTONIX) 40 MG tablet, Take 1 tablet by mouth 2 (two) times daily., Disp: , Rfl:    Risankizumab -rzaa (SKYRIZI ) 360 MG/2.4ML SOCT, Inject 1 Pen into the skin every 8 (eight) weeks., Disp: 2.4 mL, Rfl: 5   Family History  Problem Relation Age of Onset   Heart attack Mother    Hypertension Father      Social History   Tobacco Use   Smoking status: Never   Smokeless tobacco: Never  Vaping Use   Vaping status: Never Used  Substance Use Topics   Alcohol use: No    Alcohol/week: 0.0 standard drinks of alcohol   Drug use: No    Allergies as of 08/25/2023   (No Known Allergies)    Review of Systems:    All systems reviewed and negative except where noted in HPI.   Physical Exam:  BP 134/86 (BP Location: Left Arm, Chad Cardenas Position: Sitting, Cuff Size: Normal)   Pulse 80   Temp 98.1 F (36.7 C) (Oral)   Ht 5\' 7"  (1.702 m)   Wt 219 lb (99.3 kg)   BMI 34.30 kg/m  No LMP for male Chad Cardenas.  General:   Alert,  Well-developed, well-nourished, pleasant and cooperative in NAD Head:  Normocephalic and atraumatic. Eyes:  Sclera clear, no icterus.   Conjunctiva pink. Ears:  Normal auditory acuity. Nose:  No deformity, discharge, or lesions. Mouth:  No deformity or lesions,oropharynx pink & moist. Neck:  Supple; no masses or thyromegaly. Lungs:  Respirations even and unlabored.  Clear throughout to auscultation.    No wheezes, crackles, or rhonchi. No acute distress. Heart:  Regular rate and rhythm; no murmurs, clicks, rubs, or gallops. Abdomen:  Normal bowel sounds. Soft, vertical scar in midline, well-healed, non-tender and non-distended without masses, hepatosplenomegaly or hernias noted.  No guarding or rebound tenderness.   Rectal: Not performed Msk:  Symmetrical without gross deformities. Good, equal movement & strength bilaterally. Pulses:  Normal pulses noted. Extremities:  No clubbing or edema.  No cyanosis. Neurologic:  Alert and oriented x3;  grossly normal neurologically. Skin:  Intact without significant lesions or rashes. No jaundice. Psych:  Alert and cooperative. Normal mood and affect.  Imaging Studies: Reviewed  Assessment and Plan:   Chad Cardenas is a 68 y.o. male with history of hypertension, chronic GERD, Crohn's colitis diagnosed in 57s s/p partial colectomy in 1992 and 2014, with moderately active colitis based on colonoscopy 9/21, subtherapeutic Humira  levels, elevated fecal calprotectin levels, s/p Humira  reinduction and increased maintenance to weekly, followed by secondary loss of response, switched to Skyrizi  in 06/2021, currently in histologic remission.  Most recent LFTs  are elevated associated with significant weight gain  Crohn's colitis, s/p ileocolonic anastomosis: not In clinical and histologic remission Chad Cardenas developed secondary loss of response to weekly Humira  Switched to Skyrizi  in 06/2021, received 3 induction doses, currently on maintenance shot every 8 weeks Chad Cardenas always had elevated fecal calprotectin levels,.  Normalized to 61 on Skyrizi  as of April 2023., recheck levels today, recheck CBC and LFTs today Fecal calprotectin levels were elevated in 12/2022 at 259 Recheck fecal calprotectin levels Continue cholestyramine  2 times daily for chronic diarrhea which is likely secondary to subtotal colectomy.  Advised Chad Cardenas to take separately after a meal, and  and do not mix with other medications  Chronic GERD EGD unremarkable for Barrett's esophagus Continue Protonix 40 mg once daily before meals   IBD Health Maintenance  1.TB status: QuantiFERON gold negative as of 06/01/2020 2. Anemia: Not present 3.Immunizations: Hep A and B immune, recommend annual influenza vaccine, Chad Cardenas received prevnar and pneumovax.  Chad Cardenas had shingles in 2021, Chad Cardenas received first dose of Shingrix vaccine, received 2 doses 4.Cancer screening I) Colon cancer/dysplasia surveillance: Last colonoscopy 12/2019, repeat colonoscopy in 1/23 II) Skin cancer - counseled about annual skin exam by dermatology and skin protection in summer using sun screen SPF > 50, clothing 5.Bone health Vitamin D  status: normal  Bone density testing: n/a 5. Labs: Every 3-4 months 6. Smoking: n/a 7. NSAIDs and Antibiotics use: n/a  Elevated LFTs, secondary to Nash fibrosis, secondary to gain weight, sedentary lifestyle and increased carbohydrate intake.  Chad Cardenas lost about 10 pounds since last visit. Secondary liver disease workup negative Reiterated on low-fat, low-carb diet, portion control, regular exercise Nash fibrosis panel showed 4 fibrosis Discussed about risk of progression of fibrosis to cirrhosis of liver without lifestyle modification Recheck LFTs today Repeat ultrasound liver  Follow up in 6 months   Karma Oz, MD

## 2023-08-26 LAB — CBC WITH DIFFERENTIAL/PLATELET
Basophils Absolute: 0.1 10*3/uL (ref 0.0–0.2)
Basos: 1 %
EOS (ABSOLUTE): 0.2 10*3/uL (ref 0.0–0.4)
Eos: 3 %
Hematocrit: 52 % — ABNORMAL HIGH (ref 37.5–51.0)
Hemoglobin: 17.4 g/dL (ref 13.0–17.7)
Immature Grans (Abs): 0 10*3/uL (ref 0.0–0.1)
Immature Granulocytes: 0 %
Lymphocytes Absolute: 2.3 10*3/uL (ref 0.7–3.1)
Lymphs: 32 %
MCH: 33.2 pg — ABNORMAL HIGH (ref 26.6–33.0)
MCHC: 33.5 g/dL (ref 31.5–35.7)
MCV: 99 fL — ABNORMAL HIGH (ref 79–97)
Monocytes Absolute: 0.7 10*3/uL (ref 0.1–0.9)
Monocytes: 9 %
Neutrophils Absolute: 4 10*3/uL (ref 1.4–7.0)
Neutrophils: 55 %
Platelets: 171 10*3/uL (ref 150–450)
RBC: 5.24 x10E6/uL (ref 4.14–5.80)
RDW: 13.3 % (ref 11.6–15.4)
WBC: 7.2 10*3/uL (ref 3.4–10.8)

## 2023-08-26 LAB — HEPATIC FUNCTION PANEL
ALT: 77 IU/L — ABNORMAL HIGH (ref 0–44)
AST: 67 IU/L — ABNORMAL HIGH (ref 0–40)
Albumin: 4.4 g/dL (ref 3.9–4.9)
Alkaline Phosphatase: 77 IU/L (ref 44–121)
Bilirubin Total: 0.8 mg/dL (ref 0.0–1.2)
Bilirubin, Direct: 0.26 mg/dL (ref 0.00–0.40)
Total Protein: 8 g/dL (ref 6.0–8.5)

## 2023-08-27 ENCOUNTER — Ambulatory Visit: Payer: Self-pay | Admitting: Gastroenterology

## 2023-08-28 ENCOUNTER — Other Ambulatory Visit: Payer: Self-pay | Admitting: Gastroenterology

## 2023-08-28 DIAGNOSIS — K50112 Crohn's disease of large intestine with intestinal obstruction: Secondary | ICD-10-CM

## 2023-08-28 DIAGNOSIS — Z9049 Acquired absence of other specified parts of digestive tract: Secondary | ICD-10-CM

## 2023-08-28 LAB — CALPROTECTIN, FECAL: Calprotectin, Fecal: 238 ug/g — ABNORMAL HIGH (ref 0–120)

## 2023-08-29 ENCOUNTER — Ambulatory Visit
Admission: RE | Admit: 2023-08-29 | Discharge: 2023-08-29 | Disposition: A | Discharge status: Home / Self Care | Source: Ambulatory Visit | Attending: Gastroenterology | Admitting: Gastroenterology

## 2023-08-29 DIAGNOSIS — R7989 Other specified abnormal findings of blood chemistry: Secondary | ICD-10-CM | POA: Insufficient documentation

## 2023-08-29 DIAGNOSIS — K7581 Nonalcoholic steatohepatitis (NASH): Secondary | ICD-10-CM | POA: Insufficient documentation

## 2023-09-03 ENCOUNTER — Other Ambulatory Visit: Payer: Self-pay

## 2023-09-03 DIAGNOSIS — R195 Other fecal abnormalities: Secondary | ICD-10-CM

## 2023-09-25 ENCOUNTER — Ambulatory Visit: Payer: Medicare PPO | Admitting: Dermatology

## 2023-11-05 ENCOUNTER — Encounter: Payer: Self-pay | Admitting: Dermatology

## 2023-11-05 ENCOUNTER — Ambulatory Visit: Admitting: Dermatology

## 2023-11-05 DIAGNOSIS — L570 Actinic keratosis: Secondary | ICD-10-CM

## 2023-11-05 DIAGNOSIS — L820 Inflamed seborrheic keratosis: Secondary | ICD-10-CM | POA: Diagnosis not present

## 2023-11-05 DIAGNOSIS — Z1283 Encounter for screening for malignant neoplasm of skin: Secondary | ICD-10-CM | POA: Diagnosis not present

## 2023-11-05 DIAGNOSIS — Z7189 Other specified counseling: Secondary | ICD-10-CM

## 2023-11-05 DIAGNOSIS — L578 Other skin changes due to chronic exposure to nonionizing radiation: Secondary | ICD-10-CM | POA: Diagnosis not present

## 2023-11-05 DIAGNOSIS — L2089 Other atopic dermatitis: Secondary | ICD-10-CM

## 2023-11-05 DIAGNOSIS — L814 Other melanin hyperpigmentation: Secondary | ICD-10-CM | POA: Diagnosis not present

## 2023-11-05 DIAGNOSIS — Z79899 Other long term (current) drug therapy: Secondary | ICD-10-CM

## 2023-11-05 DIAGNOSIS — D1801 Hemangioma of skin and subcutaneous tissue: Secondary | ICD-10-CM

## 2023-11-05 DIAGNOSIS — W908XXA Exposure to other nonionizing radiation, initial encounter: Secondary | ICD-10-CM

## 2023-11-05 DIAGNOSIS — L910 Hypertrophic scar: Secondary | ICD-10-CM

## 2023-11-05 DIAGNOSIS — L299 Pruritus, unspecified: Secondary | ICD-10-CM

## 2023-11-05 DIAGNOSIS — D229 Melanocytic nevi, unspecified: Secondary | ICD-10-CM

## 2023-11-05 DIAGNOSIS — L219 Seborrheic dermatitis, unspecified: Secondary | ICD-10-CM

## 2023-11-05 MED ORDER — KETOCONAZOLE 2 % EX SHAM: 1.0000 | MEDICATED_SHAMPOO | CUTANEOUS | 11 refills | Status: DC

## 2023-11-05 MED ORDER — TRIAMCINOLONE ACETONIDE 0.1 % EX CREA: 1.0000 | TOPICAL_CREAM | CUTANEOUS | 2 refills | Status: AC

## 2023-11-05 NOTE — Patient Instructions (Addendum)
 Cryotherapy Aftercare  Wash gently with soap and water everyday.   Apply Vaseline and Band-Aid daily until healed.    Actinic Keratosis  What is an actinic keratosis? An actinic keratosis (plural: actinic keratoses) is growth on the surface of the skin that usually appears as a red, hard, crusty or scaly bump.   What causes actinic keratoses? Repeated prolonged sun exposure causes skin damage, especially in fair-skinned persons. Sun-damaged skin becomes dry and wrinkled and may form rough, scaly spots called actinic keratoses. These rough spots remain on the skin even though the crust or scale on top is picked off.   Why treat actinic keratoses? Actinic keratoses are not skin cancers, but because they may sometimes turn cancerous they are called "pre-cancerous". Not all will turn to skin cancer, and it usually takes several years for this to happen. Because it is much easier to treat an actinic keratosis then it is to remove a skin cancer, actinic keratoses should be treated to prevent future skin cancer.   How are actinic keratoses treated? The most common way of treating actinic keratoses is to freeze them with liquid nitrogen. Freezing causes scabbing and shedding of the sun-damaged skin. Healing after a removal usually takes two weeks, depending on the size and location of the keratosis. Hands and legs heal more slowly than the face. The skin's final appearance is usually excellent. There are several topical medications that can be used to treat actinic keratoses. These medications generally have side effects of redness, crusting, and pain. Some are used for a few days, and some for several months before the actinic keratosis is completely gone. Photodynamic therapy is another alternative to freezing actinic keratoses. This treatment is done in a physician's office. A medication is applied to the area of skin with actinic keratoses, and it is allowed to soak in for one or more hours. A special  light is then applied to the skin. Side effects include redness, burning, and peeling.  How can you prevent actinic keratoses? Protection from the sun is the best way to prevent actinic keratoses. The use of proper clothing and sunscreens can prevent the sun damage that leads to an actinic keratosis.  Unfortunately, some sun damage is permanent. Once sun damage has progressed to the point where actinic keratoses develop, new keratoses may appear even without further sun exposure. However, even in skin that is already heavily sun damaged, good sun protection can help reduce the number of actinic keratoses that will appear.   Seborrheic Keratosis  What causes seborrheic keratoses? Seborrheic keratoses are harmless, common skin growths that first appear during adult life.  As time goes by, more growths appear.  Some people may develop a large number of them.  Seborrheic keratoses appear on both covered and uncovered body parts.  They are not caused by sunlight.  The tendency to develop seborrheic keratoses can be inherited.  They vary in color from skin-colored to gray, brown, or even black.  They can be either smooth or have a rough, warty surface.   Seborrheic keratoses are superficial and look as if they were stuck on the skin.  Under the microscope this type of keratosis looks like layers upon layers of skin.  That is why at times the top layer may seem to fall off, but the rest of the growth remains and re-grows.    Treatment Seborrheic keratoses do not need to be treated, but can easily be removed in the office.  Seborrheic keratoses often cause symptoms  when they rub on clothing or jewelry.  Lesions can be in the way of shaving.  If they become inflamed, they can cause itching, soreness, or burning.  Removal of a seborrheic keratosis can be accomplished by freezing, burning, or surgery. If any spot bleeds, scabs, or grows rapidly, please return to have it checked, as these can be an indication of a  skin cancer.   Due to recent changes in healthcare laws, you may see results of your pathology and/or laboratory studies on MyChart before the doctors have had a chance to review them. We understand that in some cases there may be results that are confusing or concerning to you. Please understand that not all results are received at the same time and often the doctors may need to interpret multiple results in order to provide you with the best plan of care or course of treatment. Therefore, we ask that you please give [place] 2 business days to thoroughly review all your results before contacting the office for clarification. Should we see a critical lab result, you will be contacted sooner.   If You Need Anything After Your Visit  If you have any questions or concerns for your doctor, please call our main line at [phone] and press option 4 to reach your doctor's medical assistant. If no one answers, please leave a voicemail as directed and we will return your call as soon as possible. Messages left after 4 pm will be answered the following business day.   You may also send [place] a message via MyChart. We typically respond to MyChart messages within 1-2 business days.  For prescription refills, please ask your pharmacy to contact our office. Our fax number is [phone].  If you have an urgent issue when the clinic is closed that cannot wait until the next business day, you can page your doctor at the number below.    Please note that while we do our best to be available for urgent issues outside of office hours, we are not available 24/7.   If you have an urgent issue and are unable to reach [place], you may choose to seek medical care at your doctor's office, retail clinic, urgent care center, or emergency room.  If you have a medical emergency, please immediately call 911 or go to the emergency department.  Pager Numbers  - Dr. Gwen Pounds: [phone]  - Dr. Roseanne Reno: [phone]  - Dr.  Katrinka Blazing: [phone]   In the event of inclement weather, please call our main line at [phone] for an update on the status of any delays or closures.  Dermatology Medication Tips: Please keep the boxes that topical medications come in in order to help keep track of the instructions about where and how to use these. Pharmacies typically print the medication instructions only on the boxes and not directly on the medication tubes.   If your medication is too expensive, please contact our office at [phone] option 4 or send [place] a message through MyChart.   We are unable to tell what your co-pay for medications will be in advance as this is different depending on your insurance coverage. However, we may be able to find a substitute medication at lower cost or fill out paperwork to get insurance to cover a needed medication.   If a prior authorization is required to get your medication covered by your insurance company, please allow [place] 1-2 business days to complete this process.  Drug prices often vary depending on where the prescription  is filled and some pharmacies may offer cheaper prices.  The website www.goodrx.com contains coupons for medications through different pharmacies. The prices here do not account for what the cost may be with help from insurance (it may be cheaper with your insurance), but the website can give you the price if you did not use any insurance.  - You can print the associated coupon and take it with your prescription to the pharmacy.  - You may also stop by our office during regular business hours and pick up a GoodRx coupon card.  - If you need your prescription sent electronically to a different pharmacy, notify our office through [facility] or by phone at [phone] option 4.     Si Usted Necesita Algo Despus de Su Visita  Tambin puede enviarnos un mensaje a travs de Clinical cytogeneticist. Por lo general respondemos a los mensajes de MyChart en el transcurso  de 1 a 2 das hbiles.  Para renovar recetas, por favor pida a su farmacia que se ponga en contacto con nuestra oficina. Annie Sable de fax es [place] [phone].  Si tiene un asunto urgente cuando la clnica est cerrada y que no puede esperar hasta el siguiente da hbil, puede llamar/localizar a su doctor(a) al nmero que aparece a continuacin.   Por favor, tenga en cuenta que aunque hacemos todo lo posible para estar disponibles para asuntos urgentes fuera del horario de [place], no estamos disponibles las 24 horas del da, los 7 [address]  [address] de la [place].   Si tiene un problema urgente y no puede comunicarse con nosotros, puede optar por buscar atencin mdica  en el consultorio de su doctor(a), en una clnica privada, en un centro de atencin urgente o en una sala de emergencias.  Si tiene Engineer, drilling, por favor llame inmediatamente al 911 o vaya a la sala de emergencias.  Nmeros de bper  - Dr. Gwen Pounds: [phone]  - Dra. Roseanne Reno: [phone]  - Dr. Katrinka Blazing: [phone]   En caso de inclemencias del tiempo, por favor llame a Lacy Duverney principal al [phone] para una actualizacin sobre el [place] de cualquier retraso o cierre.  Consejos para la medicacin en dermatologa: Por favor, guarde las cajas en las que vienen los medicamentos de uso tpico para ayudarle a seguir las instrucciones sobre dnde y cmo usarlos. Las farmacias generalmente imprimen las instrucciones del medicamento slo en las cajas y no directamente en los tubos del [place].   Si su medicamento es muy caro, por favor, pngase en contacto con Rolm Gala llamando al [phone] y presione la opcin 4 o envenos un mensaje a travs de Clinical cytogeneticist.   No podemos decirle cul ser su copago por los medicamentos por adelantado ya que esto es diferente dependiendo de la cobertura de su seguro. Sin embargo, es posible que podamos encontrar un medicamento sustituto a Audiological scientist un formulario  para que el seguro cubra el medicamento que se considera necesario.   Si se requiere una autorizacin previa para que su compaa de seguros [place] su medicamento, por favor permtanos de 1 a 2 das hbiles para completar [address]  Los precios de los medicamentos varan con frecuencia dependiendo del Environmental consultant de dnde se surte la receta y alguna farmacias pueden ofrecer precios ms baratos.  El sitio web www.goodrx.com tiene cupones para medicamentos de Health and safety inspector. Los precios aqu no tienen en cuenta lo que podra costar con la ayuda del seguro (puede ser ms barato con su seguro), pero el sitio web  puede darle el precio si no utiliz [employer].  - Puede imprimir el cupn correspondiente y llevarlo con su receta a la farmacia.  - Tambin puede pasar por nuestra oficina durante el horario de atencin regular y Education officer, museum una tarjeta de cupones de GoodRx.  - Si necesita que su receta se enve electrnicamente a una farmacia diferente, informe a nuestra oficina a travs de MyChart de [place] o por telfono llamando al [phone] y presione la opcin 4.

## 2023-11-05 NOTE — Progress Notes (Signed)
 New Patient Visit   Subjective  Chad Cardenas is a 68 y.o. male who presents for the following: Skin Cancer Screening and Full Body Skin Exam scaly spots hands, fingers, warty growth legs, picks at, hx of scalp itching, , scar? ~50 yrs, hx of getting pinched, painful prn, no hx of skin cancer, no fhx of skin cancer  New patient referral from Dr. Ophelia Sage III.  The patient presents for Total-Body Skin Exam (TBSE) for skin cancer screening and mole check. The patient has spots, moles and lesions to be evaluated, some may be new or changing and the patient may have concern these could be cancer.    The following portions of the chart were reviewed this encounter and updated as appropriate: medications, allergies, medical history  Review of Systems:  No other skin or systemic complaints except as noted in HPI or Assessment and Plan.  Objective  Well appearing patient in no apparent distress; mood and affect are within normal limits.  A full examination was performed including scalp, head, eyes, ears, nose, lips, neck, chest, axillae, abdomen, back, buttocks, bilateral upper extremities, bilateral lower extremities, hands, feet, fingers, toes, fingernails, and toenails. All findings within normal limits unless otherwise noted below.   Relevant physical exam findings are noted in the Assessment and Plan.  Hands     Scalp x 1, L shoulder x 1, L lower leg x 2 (4) Stuck on waxy paps with erythema forehead x 5, R cheek x 1, R ear x 1, L cheek x 2 (9) Pink scaly macules  Assessment & Plan   SKIN CANCER SCREENING PERFORMED TODAY.  ACTINIC DAMAGE - Chronic condition, secondary to cumulative UV/sun exposure - diffuse scaly erythematous macules with underlying dyspigmentation - Recommend daily broad spectrum sunscreen SPF 30+ to sun-exposed areas, reapply every 2 hours as needed.  - Staying in the shade or wearing long sleeves, sun glasses (UVA+UVB protection) and wide brim hats  (4-inch brim around the entire circumference of the hat) are also recommended for sun protection.  - Call for new or changing lesions.  LENTIGINES, SEBORRHEIC KERATOSES, HEMANGIOMAS - Benign normal skin lesions - Benign-appearing - Call for any changes  MELANOCYTIC NEVI - Tan-brown and/or pink-flesh-colored symmetric macules and papules - Benign appearing on exam today - Observation - Call clinic for new or changing moles - Recommend daily use of broad spectrum spf 30+ sunscreen to sun-exposed areas.   SEBORRHEIC DERMATITIS with PRURITUS  scalp Exam: Pinkness and scaling  Chronic and persistent condition with duration or expected duration over one year. Condition is symptomatic / bothersome to patient. Not to goal.   Seborrheic Dermatitis is a chronic persistent rash characterized by pinkness and scaling most commonly of the mid face but also can occur on the scalp (dandruff), ears; mid chest, mid back and groin.  It tends to be exacerbated by stress and cooler weather.  People who have neurologic disease may experience new onset or exacerbation of existing seborrheic dermatitis.  The condition is not curable but treatable and can be controlled.  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 Condition is bothersome/symptomatic for patient. Currently flared.   Hand Dermatitis is a chronic type of eczema that can come and go on the hands and fingers.  While there is  no cure, the rash and symptoms can be managed with topical prescription medications, and for more severe cases, with systemic medications.  Recommend mild soap and routine use of moisturizing cream after handwashing.  Minimize soap/water  exposure when possible.    Treatment Plan TMC 0.1% cr bid 5d/wk aa  eczema on hands until clear, then prn flares, avoid f/g/a  Recommend mild soap and moisturizing cream with hand washing.   Topical steroids (such as triamcinolone , fluocinolone, fluocinonide, mometasone, clobetasol, halobetasol, betamethasone, hydrocortisone) can cause thinning and lightening of the skin if they are used for too long in the same area. Your physician has selected the right strength medicine for your problem and area affected on the body. Please use your medication only as directed by your physician to prevent side effects.   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 Your physician has selected the right strength medicine for your problem and area affected on the body. Please use your medication only as directed by your physician to prevent side effects.  INFLAMED SEBORRHEIC KERATOSIS (4) Scalp x 1, L shoulder x 1, L lower leg x 2 (4) Symptomatic, irritating, patient would like treated. Destruction of lesion - Scalp x 1, L shoulder x 1, L lower leg x 2 (4) Complexity: simple   Destruction method: cryotherapy   Informed consent: discussed and consent obtained   Timeout:  patient name, date of birth, surgical site, and procedure verified Lesion destroyed using liquid nitrogen: Yes   Region frozen until ice ball extended beyond lesion: Yes   Outcome: patient tolerated procedure well with no complications   Post-procedure details: wound care instructions given    AK (ACTINIC KERATOSIS) (9) forehead x 5, R cheek x 1, R ear x 1, L cheek x 2 (9) Actinic keratoses are precancerous spots that appear secondary to  cumulative UV radiation exposure/sun exposure over time. They are chronic with expected duration over 1 year. A portion of actinic keratoses will progress to squamous cell carcinoma of the skin. It is not possible to reliably predict which spots will progress to skin cancer and so treatment is recommended to prevent development of skin cancer.  Recommend daily broad spectrum sunscreen SPF 30+ to sun-exposed areas, reapply every 2 hours as needed.  Recommend staying in the shade or wearing long sleeves, sun glasses (UVA+UVB protection) and wide brim hats (4-inch brim around the entire circumference of the hat). Call for new or changing lesions. Destruction of lesion - forehead x 5, R cheek x 1, R ear x 1, L cheek x 2 (9) Complexity: simple   Destruction method: cryotherapy   Informed consent: discussed and consent obtained   Timeout:  patient name, date of birth, surgical site, and procedure verified Lesion destroyed using liquid nitrogen: Yes   Region frozen until ice ball extended beyond lesion: Yes   Outcome: patient tolerated procedure well with no complications   Post-procedure details: wound care instructions given     Return in about 8 months (around 07/05/2024) for Hx of AKs, Atopic Derm.  I, Grayce Saunas, RMA, am acting as scribe for Alm Rhyme, MD .   Documentation: I have reviewed the above documentation for accuracy and completeness, and I agree  with the above.  Alm Rhyme, MD

## 2023-11-11 ENCOUNTER — Encounter: Payer: Self-pay | Admitting: Dermatology

## 2024-02-25 ENCOUNTER — Other Ambulatory Visit: Payer: Self-pay | Admitting: Gastroenterology

## 2024-02-25 DIAGNOSIS — R7989 Other specified abnormal findings of blood chemistry: Secondary | ICD-10-CM

## 2024-02-25 DIAGNOSIS — K7581 Nonalcoholic steatohepatitis (NASH): Secondary | ICD-10-CM

## 2024-02-26 NOTE — Anesthesia Preprocedure Evaluation (Signed)
 Anesthesia Evaluation    Airway        Dental   Pulmonary           Cardiovascular hypertension,      Neuro/Psych    GI/Hepatic   Endo/Other    Renal/GU      Musculoskeletal   Abdominal   Peds  Hematology   Anesthesia Other Findings Hypertension GERD (gastroesophageal reflux disease) Sleep apnea History of kidney stones Arthritis Crohn's disease (HCC) Fatty liver disease, nonalcoholic  Actinic keratosis    Reproductive/Obstetrics                              Anesthesia Physical Anesthesia Plan Anesthesia Quick Evaluation

## 2024-02-27 ENCOUNTER — Encounter: Payer: Self-pay | Admitting: Gastroenterology

## 2024-03-02 ENCOUNTER — Other Ambulatory Visit: Payer: Self-pay

## 2024-03-02 ENCOUNTER — Encounter: Payer: Self-pay | Admitting: Gastroenterology

## 2024-03-02 ENCOUNTER — Encounter
Admission: RE | Disposition: A | Discharge status: Home / Self Care | Payer: Self-pay | Source: Home / Self Care | Attending: Gastroenterology

## 2024-03-02 ENCOUNTER — Encounter: Payer: Self-pay | Admitting: Anesthesiology

## 2024-03-02 ENCOUNTER — Ambulatory Visit
Admission: RE | Admit: 2024-03-02 | Discharge: 2024-03-02 | Disposition: A | Discharge status: Home / Self Care | Attending: Gastroenterology | Admitting: Gastroenterology

## 2024-03-02 DIAGNOSIS — K644 Residual hemorrhoidal skin tags: Secondary | ICD-10-CM | POA: Diagnosis not present

## 2024-03-02 DIAGNOSIS — K5010 Crohn's disease of large intestine without complications: Secondary | ICD-10-CM | POA: Insufficient documentation

## 2024-03-02 DIAGNOSIS — Z980 Intestinal bypass and anastomosis status: Secondary | ICD-10-CM | POA: Insufficient documentation

## 2024-03-02 HISTORY — DX: Fatty (change of) liver, not elsewhere classified: K76.0

## 2024-03-02 HISTORY — PX: FLEXIBLE SIGMOIDOSCOPY: SHX5431

## 2024-03-02 SURGERY — SIGMOIDOSCOPY, FLEXIBLE
Anesthesia: General

## 2024-03-02 MED ORDER — LACTATED RINGERS IV SOLN
INTRAVENOUS | Status: DC
Start: 2024-03-02 — End: 2024-03-02

## 2024-03-02 MED ORDER — 0.9 % SODIUM CHLORIDE (POUR BTL) OPTIME
TOPICAL | Status: DC | PRN
  Administered 2024-03-02: 1000 mL

## 2024-03-02 SURGICAL SUPPLY — 16 items

## 2024-03-02 NOTE — Op Note (Signed)
 [facility] Patient Name: Chad Cardenas Procedure Date: 03/02/2024 9:02 AM MRN: [id number] Account #: [id number] Date of Birth: 06-01-1955 Admit Type: Outpatient Age: 68 Room: [facility] OR ROOM 01 Gender: Male Note Status: Finalized Instrument Name: Endoscope [id number] Procedure:             Flexible Sigmoidoscopy Indications:           Crohn's disease of the colon, Follow-up of Crohn's                         disease of the colon, Disease activity assessment of                         Crohn's disease of the colon, Assess therapeutic                         response to therapy of Crohn's disease of the colon Providers:             Corinn Jess Brooklyn MD, MD Referring MD:          Ophelia Sage, MD (Referring MD) Medicines:             None Complications:         No immediate complications. Estimated blood loss:                         Minimal. Procedure:             Pre-Anesthesia Assessment:                        - Prior to the procedure, a History and Physical was                         performed, and patient medications and allergies were                         reviewed. The patient is competent. The risks and                         benefits of the procedure and the sedation options and                         risks were discussed with the patient. All questions                         were answered and informed consent was obtained.                         Patient identification and proposed procedure were                         verified by the physician, the nurse and the                         technician in the pre-procedure area in the procedure                         room in the endoscopy suite. Mental Status  Examination: alert and oriented. Airway Examination:                         normal oropharyngeal airway and neck mobility.                         Respiratory Examination: clear to auscultation. CV                          Examination: normal. Prophylactic Antibiotics: The                         patient does not require prophylactic antibiotics.                         Prior Anticoagulants: The patient has taken no                         anticoagulant or antiplatelet agents. ASA Grade                         Assessment: III - A patient with severe systemic                         disease. After reviewing the risks and benefits, the                         patient was deemed in satisfactory condition to                         undergo the procedure. The anesthesia plan was to use                         no sedation or anesthesia. Immediately prior to                         administration of medications, the patient was                         re-assessed for adequacy to receive sedatives. The                         heart rate, respiratory rate, oxygen saturations,                         blood pressure, adequacy of pulmonary ventilation, and                         response to care were monitored throughout the                         procedure. The physical status of the patient was                         re-assessed after the procedure.                        After obtaining informed consent, the scope was passed  under direct vision. The Endoscope was introduced                         through the anus and advanced to the the ileo-sigmoid                         anastomosis. The flexible sigmoidoscopy was                         accomplished without difficulty. The patient tolerated                         the procedure well. The quality of the bowel                         preparation was good. Findings:      The perianal and digital rectal examinations were normal. Pertinent       negatives include normal sphincter tone and no palpable rectal lesions.      Skin tags were found on perianal exam.      Normal mucosa was found in the rectum and in the sigmoid colon.  Biopsies       were taken with a cold forceps for histology.      Severe inflammation characterized by congestion (edema), erosions,       friability and shallow ulcerations was found at the anastomosis.       Biopsies were taken with a cold forceps for histology.      Normal neoterminal ileum, biopsies performed      Normal retroflexion Impression:            - Perianal skin tags found on perianal exam.                        - Normal mucosa in the rectum and in the sigmoid                         colon. Biopsied.                        - Severe inflammation was found at the colonic                         anastomosis secondary to Crohn's disease with colonic                         involvement. Biopsied. Recommendation:        - Discharge patient to home (with escort).                        - Resume previous diet today.                        - Await pathology results.                        - Return to my office as previously scheduled. Procedure Code(s):     --- Professional ---                        [phone], Sigmoidoscopy, flexible; with biopsy, single or  multiple Diagnosis Code(s):     --- Professional ---                        K50.10, Crohn's disease of large intestine without                         complications                        K64.4, Residual hemorrhoidal skin tags CPT copyright 2022 [employer]. All rights reserved. The codes documented in this report are preliminary and upon coder review may  be revised to meet current compliance requirements. Dr. Corinn Brooklyn Corinn Jess Brooklyn MD, MD 03/02/2024 9:25:31 AM This report has been signed electronically. Number of Addenda: 0 Note Initiated On: 03/02/2024 9:02 AM Total Procedure Duration: 0 hours 7 minutes 6 seconds  Estimated Blood Loss:  Estimated blood loss was minimal.      [facility]

## 2024-03-02 NOTE — Progress Notes (Signed)
 Patient had the procedure, but declined any sedation. Says he's had it done before without sedation.

## 2024-03-02 NOTE — OR Nursing (Signed)
 Patient is not receiving sedation from anesthesia.  Monitor vital signs through out procedure.  0910: O2: 97 P: 83 BP: 131/103  Procedure start time: 0914  0915 O2: 97 P: 78 BP: 159/104  0920 O2: 97 P 84 BP: 157/102  Procedure end time:  0921

## 2024-03-02 NOTE — H&P (Signed)
 Corinn JONELLE Brooklyn, MD [facility], [address]  [place], [place] [address]  Main: [phone] Fax:  [phone] Pager: [phone]   Primary Care Physician:  Fernande Ophelia JINNY DOUGLAS, MD Primary Gastroenterologist:  Dr. Corinn JONELLE Brooklyn  Pre-Procedure History & Physical: HPI:  Chad Cardenas is a 68 y.o. male is here for an flexible sigmoidoscopy.   Past Medical History:  Diagnosis Date   Actinic keratosis    Arthritis    Crohn's disease (HCC)    Fatty liver    Fatty liver disease, nonalcoholic    GERD (gastroesophageal reflux disease)    History of kidney stones    in the past   Hypertension    Sleep apnea    DOES NOT USE CPAP    Past Surgical History:  Procedure Laterality Date   COLON SURGERY  2014   Partial Colectomy   COLONOSCOPY WITH PROPOFOL  N/A 12/30/2019   Procedure: COLONOSCOPY WITH BIOPSY;  Surgeon: Brooklyn Corinn Skiff, MD;  Location: [facility];  Service: Endoscopy;  Laterality: N/A;  Previous colectomy, extent reached at 0923    COLONOSCOPY WITH PROPOFOL  N/A 08/21/2020   Procedure: COLONOSCOPY WITH PROPOFOL ;  Surgeon: Brooklyn Corinn Skiff, MD;  Location: [facility];  Service: Gastroenterology;  Laterality: N/A;   ESOPHAGOGASTRODUODENOSCOPY (EGD) WITH PROPOFOL  N/A 12/30/2019   Procedure: ESOPHAGOGASTRODUODENOSCOPY (EGD) WITH BIOPSY;  Surgeon: Brooklyn Corinn Skiff, MD;  Location: [facility];  Service: Endoscopy;  Laterality: N/A;  requests early appt   FLEXIBLE SIGMOIDOSCOPY N/A 05/03/2021   Procedure: FLEXIBLE SIGMOIDOSCOPY;  Surgeon: Brooklyn Corinn Skiff, MD;  Location: [facility];  Service: Endoscopy;  Laterality: N/A;   FLEXIBLE SIGMOIDOSCOPY N/A 01/15/2022   Procedure: FLEXIBLE SIGMOIDOSCOPY;  Surgeon: Brooklyn Corinn Skiff, MD;  Location: [facility];  Service: Gastroenterology;  Laterality: N/A;   HERNIA REPAIR  2014   Ventral Hernia Repair   VENTRAL HERNIA REPAIR N/A 07/04/2016   Procedure: HERNIA  REPAIR VENTRAL ADULT;  Surgeon: Larinda Unknown Sharps, MD;  Location: [facility];  Service: General;  Laterality: N/A;    Prior to Admission medications   Medication Sig Start Date End Date Taking? Authorizing Provider  Cholecalciferol (D3) 50 MCG (2000 UT) TABS Take 1 tablet by mouth daily.   Yes [provider]  Omega-3 Fatty Acids (FISH OIL) 300 MG CAPS Take 1 capsule by mouth daily.   Yes [provider]  cholestyramine  (QUESTRAN ) 4 g packet MIX 1 PACKET (4 G TOTAL) IN LIQUID OF CHOICE BY MOUTH 2 TIMES DAILY. 08/28/23   Miranda Garber Reddy, MD  fenofibrate 160 MG tablet Take 1 tablet by mouth daily. 04/26/21   [provider]  losartan (COZAAR) 100 MG tablet Take 100 mg by mouth daily. 04/22/23 04/21/24  [provider]  pantoprazole (PROTONIX) 40 MG tablet Take 1 tablet by mouth 2 (two) times daily. 09/21/21   [provider]  Risankizumab -rzaa (SKYRIZI ) 360 MG/2.4ML SOCT Inject 1 Pen into the skin every 8 (eight) weeks. 11/04/22   Brooklyn Corinn Skiff, MD  triamcinolone  cream (KENALOG ) 0.1 % Apply 1 Application topically as directed. Bid 5 days a week to aa eczema on hands, fingers until clear, then prn flares, avoid face, groin, axilla, Bid 5 days a week to scar right chest as needed for irritation and itching, avoid face, groin, axilla 11/05/23   Hester Alm BROCKS, MD    Allergies as of 02/24/2024   (No Known Allergies)    Family History  Problem Relation Age of  Onset   Heart attack Mother    Hypertension Father     Social History   Socioeconomic History   Marital status: Married    Spouse name: Not on file   Number of children: Not on file   Years of education: Not on file   Highest education level: Not on file  Occupational History   Not on file  Tobacco Use   Smoking status: Never   Smokeless tobacco: Never  Vaping Use   Vaping status: Never Used  Substance and Sexual Activity   Alcohol use: Yes    Comment: occaisionally   Drug use:  Never   Sexual activity: Not on file  Other Topics Concern   Not on file  Social History Narrative   Not on file   Social Drivers of Health   Financial Resource Strain: Low Risk  (02/24/2024)   Received from [facility]   Overall Financial Resource Strain (CARDIA)    Difficulty of Paying Living Expenses: Not hard at all  Food Insecurity: No Food Insecurity (02/24/2024)   Received from [facility]   Hunger Vital Sign    Within the past 12 months, you worried that your food would run out before you got the money to buy more.: Never true    Within the past 12 months, the food you bought just didn't last and you didn't have money to get more.: Never true  Transportation Needs: No Transportation Needs (02/24/2024)   Received from [facility]    In the past 12 months, has lack of transportation kept you from medical appointments or from getting medications?: No    Lack of Transportation (Non-Medical): No  Physical Activity: Not on file  Stress: Not on file  Social Connections: Not on file  Intimate Partner Violence: Not on file    Review of Systems: See HPI, otherwise negative ROS  Physical Exam: BP (!) 152/91   Pulse 84   Temp 98.4 F (36.9 C) (Temporal)   Resp 17   Ht 5' 6 (1.676 m)   Wt 100.8 kg   SpO2 94%   BMI 35.86 kg/m  General:   Alert,  pleasant and cooperative in NAD Head:  Normocephalic and atraumatic. Neck:  Supple; no masses or thyromegaly. Lungs:  Clear throughout to auscultation.    Heart:  Regular rate and rhythm. Abdomen:  Soft, nontender and nondistended. Normal bowel sounds, without guarding, and without rebound.   Neurologic:  Alert and  oriented x4;  grossly normal neurologically.  Impression/Plan: Chad Cardenas is here for an flexible sigmoidoscopy to be performed for crohn's colitis  Risks, benefits, limitations, and alternatives regarding  flexible sigmoidoscopy  have been reviewed with the patient.  Questions have been answered.  All parties agreeable.   Corinn Brooklyn, MD  03/02/2024, 8:31 AM

## 2024-03-02 NOTE — Progress Notes (Signed)
 Patient  very nice gentleman, states he does not want any sedation.  States, I've done this before without anything.

## 2024-03-03 ENCOUNTER — Ambulatory Visit
Admission: RE | Admit: 2024-03-03 | Discharge: 2024-03-03 | Disposition: A | Discharge status: Home / Self Care | Source: Ambulatory Visit | Attending: Gastroenterology | Admitting: Gastroenterology

## 2024-03-03 DIAGNOSIS — R7989 Other specified abnormal findings of blood chemistry: Secondary | ICD-10-CM | POA: Insufficient documentation

## 2024-03-03 DIAGNOSIS — K7581 Nonalcoholic steatohepatitis (NASH): Secondary | ICD-10-CM | POA: Diagnosis present

## 2024-03-03 LAB — SURGICAL PATHOLOGY

## 2024-03-04 ENCOUNTER — Ambulatory Visit: Payer: Self-pay | Admitting: Gastroenterology

## 2024-03-15 ENCOUNTER — Ambulatory Visit: Payer: Self-pay | Admitting: Gastroenterology

## 2024-04-30 ENCOUNTER — Other Ambulatory Visit: Payer: Self-pay | Admitting: General Surgery

## 2024-04-30 DIAGNOSIS — K432 Incisional hernia without obstruction or gangrene: Secondary | ICD-10-CM

## 2024-05-06 ENCOUNTER — Ambulatory Visit
Admission: RE | Admit: 2024-05-06 | Discharge: 2024-05-06 | Disposition: A | Discharge status: Home / Self Care | Source: Ambulatory Visit | Attending: General Surgery | Admitting: General Surgery

## 2024-05-06 DIAGNOSIS — K432 Incisional hernia without obstruction or gangrene: Secondary | ICD-10-CM | POA: Diagnosis present

## 2024-07-06 ENCOUNTER — Ambulatory Visit: Admitting: Dermatology
